# Patient Record
Sex: Female | Born: 1980 | Race: Black or African American | Hispanic: No | Marital: Single | Smoking: Never smoker
Health system: Southern US, Community
[De-identification: ages and names within clinical notes are randomized; demographics above are authoritative.]

## PROBLEM LIST (undated history)

## (undated) DIAGNOSIS — I10 Essential (primary) hypertension: Secondary | ICD-10-CM

## (undated) DIAGNOSIS — R079 Chest pain, unspecified: Secondary | ICD-10-CM

## (undated) DIAGNOSIS — D649 Anemia, unspecified: Secondary | ICD-10-CM

## (undated) HISTORY — PX: ABDOMINAL HYSTERECTOMY: SHX81

## (undated) HISTORY — PX: LEEP: SHX91

---

## 2004-05-12 ENCOUNTER — Other Ambulatory Visit: Payer: Self-pay

## 2004-12-06 ENCOUNTER — Emergency Department: Payer: Self-pay | Admitting: Emergency Medicine

## 2005-06-20 ENCOUNTER — Emergency Department: Payer: Self-pay | Admitting: Emergency Medicine

## 2006-05-26 ENCOUNTER — Emergency Department: Payer: Self-pay | Admitting: Unknown Physician Specialty

## 2006-05-27 ENCOUNTER — Emergency Department: Payer: Self-pay | Admitting: Unknown Physician Specialty

## 2007-06-05 ENCOUNTER — Emergency Department: Payer: Self-pay | Admitting: Emergency Medicine

## 2007-10-13 ENCOUNTER — Emergency Department: Payer: Self-pay

## 2011-01-13 ENCOUNTER — Emergency Department: Payer: Self-pay | Admitting: Emergency Medicine

## 2012-06-01 ENCOUNTER — Inpatient Hospital Stay: Payer: Self-pay | Admitting: Psychiatry

## 2012-06-01 LAB — URINALYSIS, COMPLETE
Bilirubin,UR: NEGATIVE
Blood: NEGATIVE
Glucose,UR: NEGATIVE mg/dL (ref 0–75)
Ketone: NEGATIVE
Ph: 7 (ref 4.5–8.0)
RBC,UR: 2 /HPF (ref 0–5)
Specific Gravity: 1.02 (ref 1.003–1.030)
Squamous Epithelial: 9

## 2012-06-01 LAB — CBC
HCT: 39.4 % (ref 35.0–47.0)
HGB: 13.2 g/dL (ref 12.0–16.0)
MCH: 28.2 pg (ref 26.0–34.0)
MCV: 84 fL (ref 80–100)
Platelet: 361 10*3/uL (ref 150–440)
RBC: 4.66 10*6/uL (ref 3.80–5.20)
WBC: 11.8 10*3/uL — ABNORMAL HIGH (ref 3.6–11.0)

## 2012-06-01 LAB — COMPREHENSIVE METABOLIC PANEL
Albumin: 3.6 g/dL (ref 3.4–5.0)
Alkaline Phosphatase: 90 U/L (ref 50–136)
Bilirubin,Total: 0.4 mg/dL (ref 0.2–1.0)
Calcium, Total: 9.1 mg/dL (ref 8.5–10.1)
Co2: 25 mmol/L (ref 21–32)
Creatinine: 0.97 mg/dL (ref 0.60–1.30)
EGFR (Non-African Amer.): >60
Glucose: 85 mg/dL (ref 65–99)
SGPT (ALT): 37 U/L (ref 12–78)
Total Protein: 8.6 g/dL — ABNORMAL HIGH (ref 6.4–8.2)

## 2012-06-01 LAB — PREGNANCY, URINE: Pregnancy Test, Urine: NEGATIVE m[IU]/mL

## 2012-06-01 LAB — DRUG SCREEN, URINE
Barbiturates, Ur Screen: NEGATIVE (ref ?–200)
Cocaine Metabolite,Ur ~~LOC~~: NEGATIVE (ref ?–300)
MDMA (Ecstasy)Ur Screen: NEGATIVE (ref ?–500)
Phencyclidine (PCP) Ur S: NEGATIVE (ref ?–25)
Tricyclic, Ur Screen: NEGATIVE (ref ?–1000)

## 2012-06-01 LAB — ETHANOL: Ethanol %: <0.003 % (ref 0.000–0.080)

## 2012-06-01 LAB — ACETAMINOPHEN LEVEL: Acetaminophen: <2 ug/mL

## 2012-06-01 LAB — SALICYLATE LEVEL: Salicylates, Serum: <1.7 mg/dL

## 2012-06-03 LAB — URINE CULTURE

## 2012-06-06 LAB — VALPROIC ACID LEVEL: Valproic Acid: 112 ug/mL — ABNORMAL HIGH

## 2012-07-14 ENCOUNTER — Emergency Department: Payer: Self-pay | Admitting: Emergency Medicine

## 2012-12-15 ENCOUNTER — Emergency Department: Payer: Self-pay | Admitting: Unknown Physician Specialty

## 2013-04-15 ENCOUNTER — Emergency Department: Payer: Self-pay | Admitting: Emergency Medicine

## 2013-04-15 LAB — URINALYSIS, COMPLETE
Bilirubin,UR: NEGATIVE
Ketone: NEGATIVE
Nitrite: NEGATIVE
Ph: 7 (ref 4.5–8.0)
Protein: NEGATIVE
Specific Gravity: 1.019 (ref 1.003–1.030)
WBC UR: 3 /HPF (ref 0–5)

## 2013-04-23 ENCOUNTER — Emergency Department: Payer: Self-pay | Admitting: Emergency Medicine

## 2013-04-23 LAB — COMPREHENSIVE METABOLIC PANEL
Albumin: 3.1 g/dL — ABNORMAL LOW (ref 3.4–5.0)
Anion Gap: 8 (ref 7–16)
Bilirubin,Total: 0.5 mg/dL (ref 0.2–1.0)
Calcium, Total: 8.9 mg/dL (ref 8.5–10.1)
Co2: 26 mmol/L (ref 21–32)
Creatinine: 0.76 mg/dL (ref 0.60–1.30)
EGFR (Non-African Amer.): >60
Glucose: 87 mg/dL (ref 65–99)
Osmolality: 273 (ref 275–301)
SGOT(AST): 19 U/L (ref 15–37)
SGPT (ALT): 18 U/L (ref 12–78)
Sodium: 138 mmol/L (ref 136–145)

## 2013-04-23 LAB — URINALYSIS, COMPLETE
Bilirubin,UR: NEGATIVE
Nitrite: NEGATIVE
Ph: 7 (ref 4.5–8.0)
Protein: 30
Specific Gravity: 1.018 (ref 1.003–1.030)
Squamous Epithelial: 6
WBC UR: 11 /HPF (ref 0–5)

## 2013-04-23 LAB — CBC
MCH: 27.5 pg (ref 26.0–34.0)
MCHC: 33.6 g/dL (ref 32.0–36.0)
RBC: 4.68 10*6/uL (ref 3.80–5.20)
RDW: 14.8 % — ABNORMAL HIGH (ref 11.5–14.5)

## 2013-06-27 ENCOUNTER — Emergency Department: Payer: Self-pay | Admitting: Internal Medicine

## 2013-06-27 LAB — URINALYSIS, COMPLETE
Bacteria: NONE SEEN
Blood: NEGATIVE
Glucose,UR: NEGATIVE mg/dL (ref 0–75)
Leukocyte Esterase: NEGATIVE
Protein: NEGATIVE
RBC,UR: <1 /HPF (ref 0–5)
Squamous Epithelial: 1

## 2013-06-27 LAB — DRUG SCREEN, URINE
Barbiturates, Ur Screen: NEGATIVE (ref ?–200)
Benzodiazepine, Ur Scrn: NEGATIVE (ref ?–200)
Cannabinoid 50 Ng, Ur ~~LOC~~: NEGATIVE (ref ?–50)
Methadone, Ur Screen: NEGATIVE (ref ?–300)
Opiate, Ur Screen: NEGATIVE (ref ?–300)
Tricyclic, Ur Screen: NEGATIVE (ref ?–1000)

## 2013-06-27 LAB — ETHANOL
Ethanol %: <0.003 % (ref 0.000–0.080)
Ethanol: <3 mg/dL

## 2013-06-27 LAB — COMPREHENSIVE METABOLIC PANEL
Co2: 29 mmol/L (ref 21–32)
EGFR (African American): >60
EGFR (Non-African Amer.): >60
Glucose: 82 mg/dL (ref 65–99)
Osmolality: 272 (ref 275–301)
Potassium: 3.8 mmol/L (ref 3.5–5.1)
SGOT(AST): 14 U/L — ABNORMAL LOW (ref 15–37)
SGPT (ALT): 22 U/L (ref 12–78)
Sodium: 137 mmol/L (ref 136–145)

## 2013-06-27 LAB — CBC
HCT: 38.5 % (ref 35.0–47.0)
HGB: 12.9 g/dL (ref 12.0–16.0)
RDW: 15.6 % — ABNORMAL HIGH (ref 11.5–14.5)

## 2013-06-27 LAB — ACETAMINOPHEN LEVEL: Acetaminophen: <2 ug/mL

## 2013-06-27 LAB — SALICYLATE LEVEL: Salicylates, Serum: <1.7 mg/dL

## 2013-06-27 LAB — TSH: Thyroid Stimulating Horm: 1.86 u[IU]/mL

## 2013-11-20 ENCOUNTER — Emergency Department: Payer: Self-pay | Admitting: Emergency Medicine

## 2013-11-20 LAB — CBC WITH DIFFERENTIAL/PLATELET
BASOS PCT: 1.3 %
Basophil #: 0.2 10*3/uL — ABNORMAL HIGH (ref 0.0–0.1)
EOS PCT: 2.4 %
Eosinophil #: 0.3 10*3/uL (ref 0.0–0.7)
HCT: 35.6 % (ref 35.0–47.0)
HGB: 12.3 g/dL (ref 12.0–16.0)
LYMPHS ABS: 3.6 10*3/uL (ref 1.0–3.6)
Lymphocyte %: 29.2 %
MCH: 28.6 pg (ref 26.0–34.0)
MCHC: 34.6 g/dL (ref 32.0–36.0)
MCV: 83 fL (ref 80–100)
MONOS PCT: 8.6 %
Monocyte #: 1 x10 3/mm — ABNORMAL HIGH (ref 0.2–0.9)
NEUTROS ABS: 7.1 10*3/uL — AB (ref 1.4–6.5)
NEUTROS PCT: 58.5 %
Platelet: 381 10*3/uL (ref 150–440)
RBC: 4.29 10*6/uL (ref 3.80–5.20)
RDW: 15.6 % — ABNORMAL HIGH (ref 11.5–14.5)
WBC: 12.2 10*3/uL — AB (ref 3.6–11.0)

## 2013-11-20 LAB — URINALYSIS, COMPLETE
Bilirubin,UR: NEGATIVE
Blood: NEGATIVE
Glucose,UR: NEGATIVE mg/dL (ref 0–75)
Ketone: NEGATIVE
Leukocyte Esterase: NEGATIVE
Nitrite: NEGATIVE
Ph: 6 (ref 4.5–8.0)
Protein: 30
Specific Gravity: 1.019 (ref 1.003–1.030)
Squamous Epithelial: 2
WBC UR: 3 /HPF (ref 0–5)

## 2013-11-20 LAB — COMPREHENSIVE METABOLIC PANEL
ANION GAP: 2 — AB (ref 7–16)
Albumin: 3.1 g/dL — ABNORMAL LOW (ref 3.4–5.0)
Alkaline Phosphatase: 83 U/L
BUN: 12 mg/dL (ref 7–18)
Bilirubin,Total: 0.5 mg/dL (ref 0.2–1.0)
CO2: 28 mmol/L (ref 21–32)
CREATININE: 0.63 mg/dL (ref 0.60–1.30)
Calcium, Total: 9 mg/dL (ref 8.5–10.1)
Chloride: 104 mmol/L (ref 98–107)
EGFR (Non-African Amer.): >60
GLUCOSE: 84 mg/dL (ref 65–99)
Osmolality: 267 (ref 275–301)
POTASSIUM: 5.6 mmol/L — AB (ref 3.5–5.1)
SGOT(AST): 56 U/L — ABNORMAL HIGH (ref 15–37)
SGPT (ALT): 21 U/L (ref 12–78)
SODIUM: 134 mmol/L — AB (ref 136–145)
TOTAL PROTEIN: 8.2 g/dL (ref 6.4–8.2)

## 2013-11-20 LAB — LIPASE, BLOOD: Lipase: 73 U/L (ref 73–393)

## 2014-01-28 ENCOUNTER — Emergency Department: Payer: Self-pay | Admitting: Emergency Medicine

## 2014-01-28 LAB — URINALYSIS, COMPLETE
BLOOD: NEGATIVE
Bilirubin,UR: NEGATIVE
GLUCOSE, UR: NEGATIVE mg/dL (ref 0–75)
KETONE: NEGATIVE
Nitrite: POSITIVE
PH: 6 (ref 4.5–8.0)
Protein: 100
RBC,UR: 5 /HPF (ref 0–5)
Specific Gravity: 1.026 (ref 1.003–1.030)
Squamous Epithelial: 10

## 2014-02-05 ENCOUNTER — Emergency Department: Payer: Self-pay | Admitting: Emergency Medicine

## 2014-02-05 LAB — CBC WITH DIFFERENTIAL/PLATELET
Basophil #: 0.1 10*3/uL (ref 0.0–0.1)
Basophil %: 0.7 %
Eosinophil #: 0.2 10*3/uL (ref 0.0–0.7)
Eosinophil %: 1.2 %
HCT: 37.6 % (ref 35.0–47.0)
HGB: 12.3 g/dL (ref 12.0–16.0)
LYMPHS PCT: 21.1 %
Lymphocyte #: 3 10*3/uL (ref 1.0–3.6)
MCH: 26.8 pg (ref 26.0–34.0)
MCHC: 32.7 g/dL (ref 32.0–36.0)
MCV: 82 fL (ref 80–100)
MONO ABS: 1.2 x10 3/mm — AB (ref 0.2–0.9)
Monocyte %: 8.6 %
NEUTROS ABS: 9.6 10*3/uL — AB (ref 1.4–6.5)
NEUTROS PCT: 68.4 %
PLATELETS: 366 10*3/uL (ref 150–440)
RBC: 4.58 10*6/uL (ref 3.80–5.20)
RDW: 16.2 % — ABNORMAL HIGH (ref 11.5–14.5)
WBC: 14 10*3/uL — AB (ref 3.6–11.0)

## 2014-02-05 LAB — COMPREHENSIVE METABOLIC PANEL
Albumin: 2.9 g/dL — ABNORMAL LOW (ref 3.4–5.0)
Alkaline Phosphatase: 84 U/L
Anion Gap: 4 — ABNORMAL LOW (ref 7–16)
BUN: 8 mg/dL (ref 7–18)
Bilirubin,Total: 0.4 mg/dL (ref 0.2–1.0)
CALCIUM: 9.3 mg/dL (ref 8.5–10.1)
CHLORIDE: 105 mmol/L (ref 98–107)
Co2: 28 mmol/L (ref 21–32)
Creatinine: 0.89 mg/dL (ref 0.60–1.30)
EGFR (Non-African Amer.): >60
GLUCOSE: 86 mg/dL (ref 65–99)
Osmolality: 271 (ref 275–301)
Potassium: 3.7 mmol/L (ref 3.5–5.1)
SGOT(AST): 12 U/L — ABNORMAL LOW (ref 15–37)
SGPT (ALT): 33 U/L (ref 12–78)
SODIUM: 137 mmol/L (ref 136–145)
Total Protein: 7.7 g/dL (ref 6.4–8.2)

## 2014-02-05 LAB — URINALYSIS, COMPLETE
BLOOD: NEGATIVE
Bilirubin,UR: NEGATIVE
GLUCOSE, UR: NEGATIVE mg/dL (ref 0–75)
KETONE: NEGATIVE
Leukocyte Esterase: NEGATIVE
Nitrite: POSITIVE
Ph: 6 (ref 4.5–8.0)
Protein: NEGATIVE
RBC,UR: <1 /HPF (ref 0–5)
Specific Gravity: 1.019 (ref 1.003–1.030)
Squamous Epithelial: <1
WBC UR: 3 /HPF (ref 0–5)

## 2014-02-05 LAB — HCG, QUANTITATIVE, PREGNANCY: Beta Hcg, Quant.: 94722 m[IU]/mL — ABNORMAL HIGH

## 2014-03-04 ENCOUNTER — Emergency Department: Payer: Self-pay | Admitting: Emergency Medicine

## 2014-03-04 LAB — CBC
HCT: 36 % (ref 35.0–47.0)
HGB: 12.1 g/dL (ref 12.0–16.0)
MCH: 27.6 pg (ref 26.0–34.0)
MCHC: 33.6 g/dL (ref 32.0–36.0)
MCV: 82 fL (ref 80–100)
PLATELETS: 323 10*3/uL (ref 150–440)
RBC: 4.39 10*6/uL (ref 3.80–5.20)
RDW: 15.7 % — ABNORMAL HIGH (ref 11.5–14.5)
WBC: 13.3 10*3/uL — ABNORMAL HIGH (ref 3.6–11.0)

## 2014-03-04 LAB — URINALYSIS, COMPLETE
BLOOD: NEGATIVE
Bilirubin,UR: NEGATIVE
Glucose,UR: NEGATIVE mg/dL (ref 0–75)
Ketone: NEGATIVE
Leukocyte Esterase: NEGATIVE
Nitrite: NEGATIVE
Ph: 6 (ref 4.5–8.0)
Protein: 30
Specific Gravity: 1.02 (ref 1.003–1.030)

## 2014-03-04 LAB — HEPATIC FUNCTION PANEL A (ARMC)
AST: 21 U/L (ref 15–37)
Albumin: 2.8 g/dL — ABNORMAL LOW (ref 3.4–5.0)
Alkaline Phosphatase: 77 U/L
Bilirubin, Direct: <0.1 mg/dL (ref 0.00–0.20)
Bilirubin,Total: 0.3 mg/dL (ref 0.2–1.0)
SGPT (ALT): 25 U/L (ref 12–78)
Total Protein: 7.5 g/dL (ref 6.4–8.2)

## 2014-03-04 LAB — HCG, QUANTITATIVE, PREGNANCY: Beta Hcg, Quant.: 81636 m[IU]/mL — ABNORMAL HIGH

## 2014-04-18 ENCOUNTER — Emergency Department: Payer: Self-pay | Admitting: Emergency Medicine

## 2014-04-18 LAB — COMPREHENSIVE METABOLIC PANEL
ALBUMIN: 2.7 g/dL — AB (ref 3.4–5.0)
ALK PHOS: 90 U/L
ANION GAP: 10 (ref 7–16)
BILIRUBIN TOTAL: 0.4 mg/dL (ref 0.2–1.0)
BUN: 7 mg/dL (ref 7–18)
CALCIUM: 9 mg/dL (ref 8.5–10.1)
CHLORIDE: 105 mmol/L (ref 98–107)
Co2: 21 mmol/L (ref 21–32)
Creatinine: 0.91 mg/dL (ref 0.60–1.30)
Glucose: 105 mg/dL — ABNORMAL HIGH (ref 65–99)
OSMOLALITY: 270 (ref 275–301)
Potassium: 3.4 mmol/L — ABNORMAL LOW (ref 3.5–5.1)
SGOT(AST): 33 U/L (ref 15–37)
SGPT (ALT): 45 U/L (ref 12–78)
Sodium: 136 mmol/L (ref 136–145)
TOTAL PROTEIN: 7.6 g/dL (ref 6.4–8.2)

## 2014-04-18 LAB — URINALYSIS, COMPLETE
Bilirubin,UR: NEGATIVE
Blood: NEGATIVE
Glucose,UR: NEGATIVE mg/dL (ref 0–75)
Ketone: NEGATIVE
NITRITE: NEGATIVE
Ph: 5 (ref 4.5–8.0)
Protein: 100
SPECIFIC GRAVITY: 1.033 (ref 1.003–1.030)
Squamous Epithelial: 2
WBC UR: 13 /HPF (ref 0–5)

## 2014-04-18 LAB — CBC
HCT: 33.9 % — ABNORMAL LOW (ref 35.0–47.0)
HGB: 11.2 g/dL — ABNORMAL LOW (ref 12.0–16.0)
MCH: 27.3 pg (ref 26.0–34.0)
MCHC: 33.1 g/dL (ref 32.0–36.0)
MCV: 83 fL (ref 80–100)
Platelet: 353 10*3/uL (ref 150–440)
RBC: 4.11 10*6/uL (ref 3.80–5.20)
RDW: 16.1 % — ABNORMAL HIGH (ref 11.5–14.5)
WBC: 14.7 10*3/uL — ABNORMAL HIGH (ref 3.6–11.0)

## 2014-04-18 LAB — HCG, QUANTITATIVE, PREGNANCY: Beta Hcg, Quant.: 23459 m[IU]/mL — ABNORMAL HIGH

## 2014-04-18 LAB — LIPASE, BLOOD: LIPASE: 98 U/L (ref 73–393)

## 2014-05-16 ENCOUNTER — Observation Stay: Payer: Self-pay | Admitting: Obstetrics & Gynecology

## 2014-05-16 LAB — PIH PROFILE
Anion Gap: 9 (ref 7–16)
BUN: 9 mg/dL (ref 7–18)
CALCIUM: 9.3 mg/dL (ref 8.5–10.1)
Chloride: 103 mmol/L (ref 98–107)
Co2: 24 mmol/L (ref 21–32)
Creatinine: 0.66 mg/dL (ref 0.60–1.30)
EGFR (African American): >60
EGFR (Non-African Amer.): >60
Glucose: 85 mg/dL (ref 65–99)
HCT: 34.3 % — ABNORMAL LOW (ref 35.0–47.0)
HGB: 11.3 g/dL — ABNORMAL LOW (ref 12.0–16.0)
MCH: 27.2 pg (ref 26.0–34.0)
MCHC: 33 g/dL (ref 32.0–36.0)
MCV: 83 fL (ref 80–100)
OSMOLALITY: 270 (ref 275–301)
Platelet: 358 10*3/uL (ref 150–440)
Potassium: 3.4 mmol/L — ABNORMAL LOW (ref 3.5–5.1)
RBC: 4.16 10*6/uL (ref 3.80–5.20)
RDW: 16.1 % — AB (ref 11.5–14.5)
SGOT(AST): 19 U/L (ref 15–37)
Sodium: 136 mmol/L (ref 136–145)
Uric Acid: 4.5 mg/dL (ref 2.6–6.0)
WBC: 16 10*3/uL — ABNORMAL HIGH (ref 3.6–11.0)

## 2014-05-16 LAB — DRUG SCREEN, URINE

## 2014-05-18 LAB — PROTEIN, URINE, 24 HOUR
Collection Hours: 24 hours
PROTEIN, 24 HOUR URINE: 176 mg/(24.h) — AB (ref 30–149)
PROTEIN, URINE: 22 mg/dL (ref 0–12)
Total Volume: 800 mL

## 2014-05-20 ENCOUNTER — Observation Stay: Payer: Self-pay

## 2014-05-20 ENCOUNTER — Encounter: Payer: Self-pay | Admitting: Maternal & Fetal Medicine

## 2014-06-10 ENCOUNTER — Encounter: Payer: Self-pay | Admitting: Obstetrics & Gynecology

## 2014-07-16 DIAGNOSIS — G43909 Migraine, unspecified, not intractable, without status migrainosus: Secondary | ICD-10-CM | POA: Insufficient documentation

## 2014-07-16 DIAGNOSIS — O99345 Other mental disorders complicating the puerperium: Secondary | ICD-10-CM | POA: Insufficient documentation

## 2014-07-16 DIAGNOSIS — I10 Essential (primary) hypertension: Secondary | ICD-10-CM | POA: Insufficient documentation

## 2014-07-16 DIAGNOSIS — F53 Postpartum depression: Secondary | ICD-10-CM | POA: Insufficient documentation

## 2014-08-19 DIAGNOSIS — N871 Moderate cervical dysplasia: Secondary | ICD-10-CM | POA: Insufficient documentation

## 2015-01-05 ENCOUNTER — Emergency Department: Payer: Self-pay | Admitting: Emergency Medicine

## 2015-02-11 NOTE — Discharge Summary (Signed)
PATIENT NAME:  LEMOYNE, Maria Dean MR#:  [id number] DATE OF BIRTH:  [birth year]  DATE OF ADMISSION:  06/01/2012 DATE OF DISCHARGE:  06/07/2012  HOSPITAL COURSE: See the dictated History and Physical for details. This 34 year old woman presented to the Emergency Room with complaints of mood lability with some alternating or mixed suicidal and at times aggressive ideation, hallucinations, feeling out of control. Had been having severe headaches, had not been sleeping recently. She had not been abusing substances but had not been getting any psychiatric treatment. Past history suggested probably one manic or mixed episode previously. Stress level was marked by her being out of work, currently in school, worried about her children but not acutely overwhelmed. There was no immediate crisis going on with her children. The presentation and looked and sounded most like probably a mixed bipolar episode. Because of the headaches as well as the mixed severe symptoms and the long periods of stability, we did pursue a work-up for any other medical causes during the time she was in the hospital.  MRI scan was done of her head which showed an abnormality in the clivus area that was undetermined on the MRI. At the recommendation of radiology, a follow-up CT scan was done focusing on that area and the neck. 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 We did do urine free 24-hour catecholamines looking for the rare but serious pheochromocytoma. The results of these, however, were entirely normal. Meanwhile, the patient was treated with medicines for bipolar disorder, specifically, Depakote and Risperdal. She was also treated with propranolol for her high blood pressure which was quite remarkable early on. Blood pressure gradually resolved. Mood states and symptoms resolved. The patient remained withdrawn and did not participate  much during her hospital stay. She tended to stay in her room most of the time. At first she was reporting still having depressed mood and some hallucinations, but those improved during her hospital stay. Her affect became more consistently stable. She did not display any aggressive behavior, did not repeat suicidal ideation. By the time of discharge it looked most likely that she does have a bipolar disorder that had presented with a mixed episode and had responded to medication. She appeared to be back to her baseline without acute dangerousness. The patient was educated in depth about appropriate treatment and management including the necessity of staying on prophylactic medicine, following up with outpatient treatment, totally avoiding intoxicating substances, maintaining a her regular sleep schedule, and managing stress. She was agreeable to all of these and agreeable to outpatient treatment referral which was made to [facility].  LABORATORY, DIAGNOSTIC, AND RADIOLOGICAL DATA: Valproic acid level done on 08/13 just before discharge was 112, which is slightly above the normal range as it is usually considered, but I felt was an appropriate range considering she was having no side effects to it. Free catecholamines without going into specific details were all completely within the normal range and unremarkable. MRI scan as noted above had one abnormality seen but on CT it appeared to be a nonsignificant congenital finding without any sign of tumor. Urinalysis was done because of abnormal urinalysis when she came in. Culture, however, was suggestive of contaminants and she was treated symptomatically. Pregnancy test negative. Urinalysis as previously mentioned had 10 white blood cells, 2 red blood cells, and 2+ leukocyte esterase. Drug screen negative. TSH normal at 1.7. CBC showed a slightly elevated white  count at 11.8, otherwise unremarkable. Alcohol level undetectable. Chemistry panel slightly elevated. Protein  at 8.6, otherwise unremarkable.   DISCHARGE MEDICATIONS:  1. Depakote 500 mg 3 times per day.  2. Restoril 15 mg at bedtime.  3. Risperdal 3 mg at bedtime.  4. Propranolol LA 80 mg per day.  5. Hydrochlorothiazide 50 mg per day.   MENTAL STATUS EXAM AT DISCHARGE: Neatly dressed and groomed woman. Cooperative and pleasant with the interview. Eye contact normal. Psychomotor activity normal. Speech normal in rate, tone, and volume. Affect euthymic, reactive, and appropriate. Mood stated as being good. Thoughts are lucid without any sign of bizarre or delusional thinking. Denies hallucinations. Denies any suicidal or homicidal ideation. Shows intact insight and judgment. Short and long-term memory grossly intact. Intelligence is at least average. No sign of acute dangerousness. Expresses good understanding of the treatment plan and risks and benefits.   DISPOSITION: She is discharged back to the home that she had been staying at with a referral appointment made for the local mental health agency [facility] for outpatient medication and therapy followup.   DIAGNOSIS PRINCIPLE AND PRIMARY:  AXIS I: Bipolar disorder, type I, mixed episode.   SECONDARY DIAGNOSES:  AXIS I: No further diagnosis.   AXIS II: No diagnosis.   AXIS III:  Hypertension. Headaches, possibly migraine in type. Overweight.  Urinary tract infection, resolved.   AXIS IV: Moderate stress from being out of work, being somewhat unstable in her living situation.   AXIS V: Functioning at time of discharge: 60.     ____________________________ Gonzella Lex, MD jtc:bjt D: 06/20/2012 11:28:13 ET T: 06/20/2012 12:24:46 ET JOB#: [id number]  cc: Gonzella Lex, MD, <Dictator> Gonzella Lex MD ELECTRONICALLY SIGNED 06/20/2012 16:46

## 2015-02-11 NOTE — Consult Note (Signed)
Psychological Assessment  Maria  Jones30of Evaluation: 8-12-13Administered: [place] (MMPI-2) for Referral: Ms. Dona was referred for a psychological assessment by her physician, Christoper Allegra, MD.  She was admitted to [facility] for the treatment mood liability with auditory hallucinations. Please see the history and physical and psychosocial history for further background information. An assessment of personality structure was requested. The MMPI-2 validity scales indicate that the clinical profile is not valid. This score is consistent with malingering or a serious disturbance. If the score is not exaggerated related to malingering the profile is probably not stable as it reflects an acute disturbance. Impression:is not interpretable.Psychotic Disorder NOS   Electronic Signatures: Garald Braver (PsyD, HSP-P)  (Signed on 13-Aug-13 12:24)  Authored  Last Updated: 13-Aug-13 12:24 by Garald Braver (PsyD, HSP-P)

## 2015-02-11 NOTE — H&P (Signed)
PATIENT NAME:  Maria Dean, Maria Dean MR#:  [id number] DATE OF BIRTH:  Jan 01, 1981  DATE OF ADMISSION:  06/01/2012  IDENTIFYING INFORMATION AND CHIEF COMPLAINT: 34 year old woman came into the Emergency Room seeking evaluation for mood lability, inability to sleep, racing thoughts, hallucinations at times.   CHIEF COMPLAINT: "I've been through a lot."   HISTORY OF PRESENT ILLNESS: Information obtained from the patient. She reports that for the past two months she has been going through an episode characterized by mood swings in which she will at one moment be crying and at other moments be happy and agitated. Has also been at times feeling more irritable and angry. She has not been sleeping well. Says that she will go days where she does not sleep at all and still does not feel tired. At times she has auditory hallucinations hearing her name be called. She denies any suicidal ideation or homicidal ideation. She feels like she has not been able to take care of her usual activities. She is worried about taking care of her children when they come back from vacation. She has not been to see anybody for any treatment for this episode over the last couple of months as it has been getting worse. She denies that she abuses drugs or uses any alcohol. She is not clear that there has been any major new stress in her life.   PAST PSYCHIATRIC HISTORY: She says that in 2008 she had a similar episode but it only lasted for a few days. She also says that she has seen a doctor for problems with her mood in the past and for insomnia. She remembers having been treated with Paxil which she only took for about 2 to 3 weeks before it was discontinued. She does not recall having any effect. Denies any history of suicide attempts or any history of violence. No previous psychiatric hospitalizations. No previous diagnosis known.   PAST MEDICAL HISTORY: Patient denies having any ongoing or acute medical problems.   SOCIAL HISTORY:  Patient is currently not working. She is getting an online degree which takes up a lot of her time. She is currently staying with a friend of hers. She has three young children but they are staying with their grandmother for the summer. Patient has worked at call centers in the past. Graduated from high school. Not married or in any current relationship.   FAMILY HISTORY: She does not know of any family history of mental illness at all.   REVIEW OF SYSTEMS: Complains that her mood has been labile with crying and laughing spells alternating. Not sleeping well at all. Occasional auditory hallucinations. Denies suicidal ideation. Denies any new physical symptoms.   MENTAL STATUS EXAM: Slightly disheveled woman interviewed in the Emergency Room. She is alert and oriented and cooperative with the interview. Eye contact is good. Psychomotor activity is a little bit fidgety but not extremely so. Speech is normal rate, tone, and volume. Affect is currently euthymic, calm, smiling. Mood is stated as being up and down. Thoughts appear generally lucid. No obvious loosening of associations or delusional thinking. Denies any hallucinations at this time but has recently had auditory hallucinations. Denies any suicidal or homicidal ideation. Judgment and insight currently seem to be adequate. Intelligence is at least average. Short and longer term memory grossly intact.   PHYSICAL EXAMINATION:  GENERAL: Patient is a moderately overweight woman who does not appear to be in any immediate physical distress. She does not appear to be in any pain.  SKIN: No acute skin lesions. Her eyes are muddy bilaterally but do not appear bloodshot or traumatized.   HEENT: Pupils are equal and reactive. Face and head are symmetric.   NECK: Nontender.   BACK: Nontender.   MUSCULOSKELETAL: Full range of motion at all extremities and normal gait. Strength and reflexes normal throughout.   LUNGS: Clear with no wheezes.   HEART:  Regular rate and rhythm. No extra sounds.   ABDOMEN: Soft, nontender, normal bowel sounds.   VITAL SIGNS: Vital signs most recently show a temperature of 98.3, pulse 107, respirations 20, and blood pressure elevated at 172/117, we have not had a repeat on that yet.   ASSESSMENT: This is a 34 year old woman with two months worth of symptoms of mood lability, not sleeping, agitation clearly different from her baseline. Some psychotic symptoms. Not acutely suicidal. Has had past episode similar to this. No cleared treatment history but the current presentation sounds most like a mixed episode of bipolar disorder. Could also be a brief psychotic episode or stress reaction. [facility] admitting patient to the hospital because of the severity of the symptoms and need for immediate improvement and the fact that she has not been able to get in to see a psychiatrist otherwise.   TREATMENT PLAN: Admit to psychiatry. I am going to start her on Depakote at night as well as try to get her a night's sleep with some Ambien on top of that, p.r.n. Ativan for anxiety and agitation. Engage patient in groups and activities for education and therapy. Try and get collateral history. Work on follow-up planning.   DIAGNOSIS PRINCIPLE AND PRIMARY:  AXIS I: Bipolar disorder, not otherwise specified.   SECONDARY DIAGNOSES:  AXIS I: No further.   AXIS II: No diagnosis.   AXIS III: Hypertension, unclear etiology.   AXIS IV: Moderate. Chronic stress from joblessness.   AXIS V: Functioning at time of admission is 91.   ____________________________ Gonzella Lex, MD jtc:cms D: 06/01/2012 18:45:27 ET T: 06/02/2012 06:27:42 ET JOB#: [id number]  cc: Gonzella Lex, MD, <Dictator> Gonzella Lex MD ELECTRONICALLY SIGNED 06/02/2012 9:00

## 2015-02-11 NOTE — Consult Note (Signed)
Brief Consult Note: Diagnosis: bipolar nos.   Patient was seen by consultant.   Recommend further assessment or treatment.   Orders entered.   Comments: Psychiatry: Patient seen. Patient having mixed agitation and mood lability and unable to sleep. Auditory hallucinations at times. Will admit to [facility] and start meds for mood stabilization. Patient agreeable.  Electronic Signatures: Gonzella Lex (MD)  (Signed 08-Aug-13 18:38)  Authored: Brief Consult Note   Last Updated: 08-Aug-13 18:38 by Gonzella Lex (MD)

## 2015-02-14 NOTE — Consult Note (Signed)
Brief Consult Note: Diagnosis: bipolar disorder.   Patient was seen by consultant.   Consult note dictated.   Recommend further assessment or treatment.   Orders entered.   Comments: Psychiatry: Patient seen. Chart reviewed. Full note done. Patient with history of bipolar disorder is having return of symptoms since being off medication. Will restart meds. Unit currently full. REevaluatte tomorrow.  Electronic Signatures: Gonzella Lex (MD)  (Signed 03-Sep-14 22:59)  Authored: Brief Consult Note   Last Updated: 03-Sep-14 22:59 by Gonzella Lex (MD)

## 2015-02-14 NOTE — Consult Note (Signed)
Brief Consult Note: Diagnosis: Bipolar disorder.   Patient was seen by consultant.   Consult note dictated.   Recommend further assessment or treatment.   Orders entered.   Comments: Pt sen in Ed. She stated that she came here as she was feeling stressed oout and having mood swings related to non complaince with her medications. She was not following with [facility] for the last 4-5 months. She was restarted back on her medications and now she is feeling better. She slept well. Lives with her family. She is planning to follow with Dr A. Denied mood swings, agitation and paranoia.  No Si/HI or plans noted.   Plan; Will be d/c from [facility] Prescription given.  Follow up at [facility].  Electronic Signatures: Jeronimo Norma (MD)  (Signed 04-Sep-14 11:44)  Authored: Brief Consult Note   Last Updated: 04-Sep-14 11:44 by Jeronimo Norma (MD)

## 2015-02-14 NOTE — Consult Note (Signed)
PATIENT NAME:  Maria Dean, Maria Dean MR#:  [id number] DATE OF BIRTH:  October 03, 1981  DATE OF CONSULTATION:  06/27/2013  CONSULTING PHYSICIAN:  Gonzella Lex, MD  IDENTIFYING INFORMATION AND REASON FOR CONSULT: This is a 34 year old woman with a history of bipolar disorder who presented with a return of symptoms.   CHIEF COMPLAINT: "I didn't know where else to go."   HISTORY OF PRESENT ILLNESS: Information obtained from the patient and the chart. The patient presented voluntarily to the Emergency Room today stating she had had a return of her symptoms. For the last few months, her mood has been getting increasingly angry and irritable. She is losing her temper more frequently. Also, feeling sad and depressed. She is not sleeping more than a couple hours a night. Appetite has been poor. She has been having a return of auditory hallucination that sounds like her own voice saying negative things to her. She has had some suicidal thoughts, but has not acted on it. She has been off of all of her psychiatric medicines for several months because her insurance lapsed. As a result, she has not been following up with her outpatient provider. She is also off of all of her medical medications for her blood pressure. She has continued to work and take care of her children, but her life has become more stressful. She is currently also living with her sister and her sister's children, which has brought extra drama into her life. Not abusing any substances.   PAST PSYCHIATRIC HISTORY:  One previous hospitalization here about a year ago for similar kind of symptoms. Diagnosis at that time was bipolar disorder mixed. She also had a history of prior to that of other episodes. The patient responded well to medication previously and had followed up with [facility] until her insurance lapsed. She has not had any actual suicide attempts. No history of violence to others. She was taking Depakote and Risperdal as well as temazepam for her  sleep as primary medication previously.   PAST MEDICAL HISTORY: The patient is overweight, but also has severe high blood pressure. When she is not on medication, it can easily run to a systolic over 026 and a diastolic over 378. She also has a history of chronic headaches, some of them migraines.   SOCIAL HISTORY: The patient is a single mother of three adolescent children. She is currently working, at [employer]. She has struggled to work and go to school over the last couple years. She is currently staying with family members and although she is not fighting with them, she says that produces extra stress in her life.   SUBSTANCE ABUSE HISTORY: No past history of substance abuse.   FAMILY HISTORY: Unknown.   CURRENT MEDICATIONS: None.   ALLERGIES: No known drug allergies.   REVIEW OF SYSTEMS: Depressed and irritable mood. Mood swings. Auditory hallucinations. Poor sleep. Poor appetite. Denies homicidal ideation. Chronic headaches.   MENTAL STATUS EXAMINATION: Slightly disheveled woman, looks her stated age, cooperative with the interview. Good eye contact. Normal psychomotor activity. Speech is normal in rate, tone and volume. Affect is appropriate and tearful at times. Mood is stated as being bad. Thoughts are lucid without any sign of loosening of associations or delusions. Denies any homicidal ideation. Has suicidal ideation without intent or plan. Judgment and insight good. Alert and oriented. Normal intelligence.   LABORATORY RESULTS: Drug screen is all negative, salicylates and acetaminophen negative. Pregnancy test negative. Urinalysis unremarkable. TSH normal. Alcohol undetected. Chemistry and CBC unremarkable.  ASSESSMENT: A 34 year old woman with bipolar disorder who has been off of her medicine. Increasing presence of symptoms including mood swings, depressed mood, suicidal ideation and hallucinations. The patient requires inpatient treatment at this time.   TREATMENT PLAN:  Unfortunately we have no beds available on the psychiatry ward. The patient understands this. We will restart treatment as she has had it previously with Depakote Risperdal and Restoril as well as restarting her blood pressure medicine. We will continually re-evaluate her. If a bed opens up while she still needs inpatient treatment,  we can choose to admit her. I will see also if someone can possibly help her with getting her Medicaid restarted.   DIAGNOSIS, PRINCIPAL AND PRIMARY:   AXIS I: Bipolar disorder type I, mixed episode.   SECONDARY DIAGNOSES: AXIS I: No further diagnosis.   AXIS II: No diagnosis.   AXIS III: Overweight, high blood pressure, chronic headaches.   AXIS IV: Severe from financial strain, raising children, single motherhood.   AXIS V: Functioning at time of evaluation 30.    ____________________________ Gonzella Lex, MD jtc:cc D: 06/27/2013 23:06:35 ET T: 06/27/2013 23:47:08 ET JOB#: [id number]  cc: Gonzella Lex, MD, <Dictator> Gonzella Lex MD ELECTRONICALLY SIGNED 06/28/2013 12:39

## 2015-02-15 NOTE — Consult Note (Signed)
Referral Information:  Prenatal Hx Maria Dean is a 34 year-old G4 P3 at 7 weeks who presented today to [facility] for Genetic Counseling due to an increased risk for Down Syndrome by multiple marker screen. She had a normal anat scan with [place] on 05/20/14.  At the time of her anatomy scan her blood pressure was elevated at 181/95. She was sent to [facility] L&D and her preeclmapsia evaluation was negative. Her care was transferred to [facility] high-risk Ob but she did not keep her appointment. It has been rescheduled for this Thursday, August 20.  She is taking Procardia 30 mg XL daily. She states she took it today. She denies headache, blurry vision, or swelling.  Today: Wt: 304 lbs (303 on 05/20/14) BP: 190/106, 181/96 Urine dip: trace protein LE: no edema Denies abdominal pain.  Findings discussed. Recommned that patient be evaluated for preeclampsia/worsening HTN on L&D at [facility]. I called the CNM covering [facility] and she is aware Pt to go to [facility] L&D. Driving instructions given. If discharged, has f/u appt with Dr. Etter Sjogren on Thursday Aug 20. [place] report from 05/20/14 and [facility] labs from 05/20/14 faxed to [facility] L&D [facility] done today. [place] testing sent.   Allergies:   Orphenadrine: N/V/Diarrhea   >50% of visit spent in couseling/coordination of care yes   Office Use Only no charge   Coding Description: MATERNAL CONDITIONS/HISTORY INDICATION(S).   HTN - Chronic.  Electronic Signatures: Derris Millan, [place] (MD)  (Signed 17-Aug-15 11:34)  Authored: Referral, Allergies, Billing, Coding Description   Last Updated: 17-Aug-15 11:34 by Cirilo Canner, [place] (MD)

## 2015-02-15 NOTE — Consult Note (Signed)
PATIENT NAME:  KINJAL, NEITZKE MR#:  [id number] DATE OF BIRTH:  1981-09-07  DATE OF CONSULTATION:  11/21/2013  REFERRING PHYSICIAN:   CONSULTING PHYSICIAN:  Shanie Mauzy A. Krisha Beegle, MD  REASON FOR CONSULTATION:  Gallstone on ultrasound.   HISTORY OF PRESENT ILLNESS:  Ms. Tatum is a pleasant 34 year old female with history of hypertension and presents with one days of myalgias.  She reported that her entire body hurts.  This is attributed to her flu shot two weeks ago, also with some mid upper back pain.  No abdominal pain reported, but says that she is tender when people push on her abdomen.  No fevers, chills, night sweats, shortness of breath, cough, chest pain, abdominal pain, nausea, vomiting, diarrhea, constipation.  Last bowel movement today.  Has not been eating because she feels weak.  No dysuria, hematuria.   PAST MEDICAL HISTORY: 1.  Hypertension.  2.  Bipolar.  3.  Migraines.   OUTPATIENT MEDICATIONS:  None.   ALLERGIES:  Orphenadrine.   SOCIAL HISTORY:  Lives in [place].  No tobacco use.  Social alcohol use.   FAMILY HISTORY:  Denies a family history of diabetes, heart problems, cancer.   REVIEW OF SYSTEMS:  A 12 point review of systems obtained.  Pertinent positives and negatives as above.   PHYSICAL EXAMINATION:  VITAL SIGNS:  Temperature 97.8, pulse 94, blood pressure 170/111, respirations 18.  GENERAL:  No acute distress, alert and oriented x 3.  HEAD:  Normocephalic, atraumatic. EYES:  No scleral icterus.  No conjunctivitis.  FACE:  No obvious facial trauma.  Normal external nose.  Normal external ears.  CHEST:  Lungs clear to auscultation.  Moving air well.  HEART:  Regular rate and rhythm.  No murmurs, rubs or gallops. BACK:  Does have tenderness in her upper spine to palpation and the peri-spinomuscular tissue.  ABDOMEN:  Soft, tender to palpation diffusely.  No focal tenderness.  EXTREMITIES:  Moves extremities well.  Strength 5 out of 5.  NEUROLOGIC:   Cranial nerves II through XII grossly intact.  Sensation intact to all four extremities.   LABORATORY DATA:  White cell count of 12.2, neutrophils 59%, AST is 56, potassium is 5.6.   Ultrasound shows a large mobile stone.  No pericholecystic fluid.  No gallbladder wall thickening.   ASSESSMENT AND PLAN:  Ms. Townsel is a pleasant 34 year old with diffuse myalgias and gallstone, unlikely cholecystitis or symptomatic cholelithiasis.  Favor viral illness.  No obvious surgical issues.     ____________________________ Glena Norfolk Jazlene Bares, MD cal:ea D: 11/21/2013 00:50:08 ET T: 11/21/2013 01:51:34 ET JOB#: [id number]  cc: Harrell Gave A. Azarya Oconnell, MD, <Dictator> Floyde Parkins MD ELECTRONICALLY SIGNED 11/26/2013 8:26

## 2015-03-04 NOTE — H&P (Signed)
L&D Evaluation:  History Expanded:  HPI 34 yo G4P3 at 23 week 3 days, who was sent to l and from the [facility] for eleveated BP of 180/100. This is not an unusaul BP for the patient as her medcial record on 04/25/14 at the [facility] showas a BP of 170/100/ SHe has no history of elevated BP and she is morbidly obese with a BMI of 52. She has had 3 SVD n the past. her first [place] was 03/04/2014 but no BP is available from that vivist. pt is also a drinkwer of ETOH during pregnanacy last tome 02/22/14. Bp at 20 weeks of pregnancy was 140/80 with 2+ protein. no wprk up done at that time. At that time she was wakling up every day with a HA currwently patient is drinkin about 32 oz of coke a day. pt had trich at 20 weeks and was treated with flagyl 2 gms po, pt is A pos/RI/VI/  papa ASC [place] pos HPV. she has a history of depression, she has had a hx of migraines, NKDA   Gravida 4   Term 3   PreTerm 0   Abortion 0   Living 3   Blood Type (Maternal) A positive   Group B Strep Results Maternal (Result >5wks must be treated as unknown) unknown/result > 5 weeks ago    Maternal HIV Negative   Maternal Syphilis Ab Nonreactive   Maternal Varicella Immune   Rubella Results (Maternal) immune   Maternal T-Dap Unknown   [facility] 10-Sep-2014   Presents with elevated BP   Patient's Medical History Hypertension  headaches    Patient's Surgical History none    Medications Pre Natal Vitamins  has trouble swallowing pills as they get down and she throws it up    Allergies NKDA   Social History EtOH    Family History Non-Contributory  chtn    Current Prenatal Course Notable For Morbid Obesity  late PNC, CHTN unitntreated    ROS:  ROS All systems were reviewed.  HEENT, CNS, GI, GU, Respiratory, CV, Renal and Musculoskeletal systems were found to be normal.   Exam:  Vital Signs BP >140/90    Urine Protein trace   General no apparent distress, lethargic   Mental Status clear    Chest clear    Abdomen gravid,  non-tender   Estimated Fetal Weight Small for gestational age   Back no CVAT   Edema no edema    Reflexes 1+    Clonus positive   Pelvic no external lesions, cervix closed and thick, clo   Mebranes Intact   FHT normal rate with no decels, cat 2 only 23 weeks,.   Fetal Heart Rate 145    Ucx absent   Skin dry   Lymph no lymphadenopathy    Other has headache but always ahas a headache   Impression:  Impression morbid obesity, chtn, headchae from bp.   Plan:  Plan UA, monitor BP, PIH panel, uds, 24 hr urine tot protein   Comments pt threw up the labetalol as she can not keep pills down and says she will not be taking pills if we give to her. explained the importance, of keeping BP down to preserve kidney fucntion and need for dialysis with in the year. she doe not seem to care. will try procardia with capsule and coating so that she tolerates iot better or coat ot on peanut butter. She is obviously able to eat as she is morbidly obese. She is doing a  24 hour tot urine protein to get a baseline and this was explained to her she states she has no way of getting it back to [place]. so we will keep her overnight to complete it and she can go home after. her CR is 0.66, her uric acid is 4.5, her potassium is low but she will not take pills, so will give IV k if it gets any lower. HEr serial BPs ion bed are very good at 022-336 systolic, she can not stay in bed. so we will try the procardia and watch her over night.   Electronic Signatures: Erik Obey (MD)  (Signed 23-Jul-15 21:13)  Authored: L&D Evaluation   Last Updated: 23-Jul-15 21:13 by Erik Obey (MD)

## 2015-03-06 ENCOUNTER — Emergency Department: Payer: Medicaid Other

## 2015-03-06 ENCOUNTER — Emergency Department
Admission: EM | Admit: 2015-03-06 | Discharge: 2015-03-07 | Disposition: A | Discharge status: Home / Self Care | Payer: Medicaid Other | Attending: Emergency Medicine | Admitting: Emergency Medicine

## 2015-03-06 ENCOUNTER — Encounter: Payer: Self-pay | Admitting: *Deleted

## 2015-03-06 ENCOUNTER — Other Ambulatory Visit: Payer: Self-pay

## 2015-03-06 DIAGNOSIS — I10 Essential (primary) hypertension: Secondary | ICD-10-CM | POA: Insufficient documentation

## 2015-03-06 DIAGNOSIS — R109 Unspecified abdominal pain: Secondary | ICD-10-CM | POA: Insufficient documentation

## 2015-03-06 DIAGNOSIS — Z3202 Encounter for pregnancy test, result negative: Secondary | ICD-10-CM | POA: Insufficient documentation

## 2015-03-06 HISTORY — DX: Essential (primary) hypertension: I10

## 2015-03-06 LAB — CBC WITH DIFFERENTIAL/PLATELET
Basophils Absolute: 0.1 10*3/uL (ref 0–0.1)
Basophils Relative: 0 %
EOS ABS: 0.2 10*3/uL (ref 0–0.7)
EOS PCT: 2 %
HEMATOCRIT: 36.2 % (ref 35.0–47.0)
Hemoglobin: 12.1 g/dL (ref 12.0–16.0)
LYMPHS ABS: 3.6 10*3/uL (ref 1.0–3.6)
LYMPHS PCT: 24 %
MCH: 26.7 pg (ref 26.0–34.0)
MCHC: 33.5 g/dL (ref 32.0–36.0)
MCV: 79.7 fL — ABNORMAL LOW (ref 80.0–100.0)
Monocytes Absolute: 0.9 10*3/uL (ref 0.2–0.9)
Monocytes Relative: 6 %
Neutro Abs: 10.2 10*3/uL — ABNORMAL HIGH (ref 1.4–6.5)
Neutrophils Relative %: 68 %
Platelets: 377 10*3/uL (ref 150–440)
RBC: 4.53 MIL/uL (ref 3.80–5.20)
RDW: 15.9 % — ABNORMAL HIGH (ref 11.5–14.5)
WBC: 15 10*3/uL — AB (ref 3.6–11.0)

## 2015-03-06 LAB — COMPREHENSIVE METABOLIC PANEL
ALK PHOS: 86 U/L (ref 38–126)
ALT: 29 U/L (ref 14–54)
AST: 22 U/L (ref 15–41)
Albumin: 3.7 g/dL (ref 3.5–5.0)
Anion gap: 6 (ref 5–15)
BILIRUBIN TOTAL: 0.6 mg/dL (ref 0.3–1.2)
BUN: 15 mg/dL (ref 6–20)
CHLORIDE: 105 mmol/L (ref 101–111)
CO2: 27 mmol/L (ref 22–32)
Calcium: 9.1 mg/dL (ref 8.9–10.3)
Creatinine, Ser: 1.3 mg/dL — ABNORMAL HIGH (ref 0.44–1.00)
GFR, EST NON AFRICAN AMERICAN: 53 mL/min — AB (ref >60)
GLUCOSE: 91 mg/dL (ref 65–99)
POTASSIUM: 3.6 mmol/L (ref 3.5–5.1)
SODIUM: 138 mmol/L (ref 135–145)
Total Protein: 8.2 g/dL — ABNORMAL HIGH (ref 6.5–8.1)

## 2015-03-06 LAB — URINALYSIS COMPLETE WITH MICROSCOPIC (ARMC ONLY)
Bacteria, UA: NONE SEEN
Bilirubin Urine: NEGATIVE
GLUCOSE, UA: NEGATIVE mg/dL
Hgb urine dipstick: NEGATIVE
Ketones, ur: NEGATIVE mg/dL
LEUKOCYTES UA: NEGATIVE
Nitrite: NEGATIVE
Protein, ur: 100 mg/dL — AB
Specific Gravity, Urine: 1.027 (ref 1.005–1.030)
pH: 6 (ref 5.0–8.0)

## 2015-03-06 LAB — PREGNANCY, URINE: PREG TEST UR: NEGATIVE

## 2015-03-06 MED ORDER — HYDROMORPHONE HCL 1 MG/ML IJ SOLN
1.0000 mg | Freq: Once | INTRAMUSCULAR | Status: AC
  Administered 2015-03-06: 1 mg via INTRAVENOUS

## 2015-03-06 MED ORDER — HYDROMORPHONE HCL 1 MG/ML IJ SOLN
INTRAMUSCULAR | Status: AC
  Administered 2015-03-06: 1 mg via INTRAVENOUS
  Filled 2015-03-06: qty 1

## 2015-03-06 MED ORDER — ONDANSETRON HCL 4 MG/2ML IJ SOLN
INTRAMUSCULAR | Status: AC
  Administered 2015-03-06: 4 mg via INTRAVENOUS
  Filled 2015-03-06: qty 2

## 2015-03-06 MED ORDER — HYDROMORPHONE HCL 1 MG/ML IJ SOLN
INTRAMUSCULAR | Status: AC
  Filled 2015-03-06: qty 1

## 2015-03-06 MED ORDER — OXYCODONE-ACETAMINOPHEN 5-325 MG PO TABS: 1.0000 | ORAL_TABLET | ORAL | Status: DC | PRN

## 2015-03-06 MED ORDER — ONDANSETRON HCL 4 MG/2ML IJ SOLN
4.0000 mg | Freq: Once | INTRAMUSCULAR | Status: AC
  Administered 2015-03-06: 4 mg via INTRAVENOUS

## 2015-03-06 NOTE — ED Provider Notes (Signed)
[facility] Emergency Department Provider Note    Time seen: 8:30 PM  I have reviewed the triage vital signs and the nursing notes.   HISTORY  Chief Complaint Abdominal Pain    HPI Maria Dean is a 34 y.o. female 's ER for right flank pain and abdominal pain goes down her right groin since Sunday has had nausea and vomiting since Monday only urinated twice today. It is severe sharp right side. Nothing makes it better movement or touching the back at all makes it worse.    Past Medical History  Diagnosis Date  . Hypertension     There are no active problems to display for this patient.   Past Surgical History  Procedure Laterality Date  . Cesarean section      No current outpatient prescriptions on file.  Allergies Review of patient's allergies indicates not on file.  No family history on file.  Social History History  Substance Use Topics  . Smoking status: Never Smoker   . Smokeless tobacco: Not on file  . Alcohol Use: No    Review of Systems Constitutional: Negative for fever. Eyes: Negative for visual changes. ENT: Negative for sore throat. Cardiovascular: Negative for chest pain. Respiratory: Negative for shortness of breath. Gastrointestinal: Positive for abdominal pain, negative for vomiting and diarrhea. Genitourinary: Infrequent urination Musculoskeletal: Severe right lower back pain Skin: Negative for rash. Neurological: Negative for headaches, focal weakness or numbness.  10-point ROS otherwise negative.  ____________________________________________   PHYSICAL EXAM:  VITAL SIGNS: ED Triage Vitals  Enc Vitals Group     BP 03/06/15 1931 211/126 mmHg     Pulse Rate 03/06/15 1931 101     Resp 03/06/15 1931 18     Temp 03/06/15 1931 98.3 F (36.8 C)     Temp Source 03/06/15 1931 Oral     SpO2 03/06/15 1931 100 %     Weight 03/06/15 1931 205 lb (92.987 kg)     Height 03/06/15 1931 5\' 5"  (1.651 m)     Head  Cir --      Peak Flow --      Pain Score 03/06/15 1932 9     Pain Loc --      Pain Edu? --      Excl. in [place]? --     Constitutional: Alert and oriented. Well appearing and in no distress. Eyes: Conjunctivae are normal. PERRL. Normal extraocular movements. ENT   Head: Normocephalic and atraumatic.   Nose: No congestion/rhinnorhea.   Mouth/Throat: Mucous membranes are moist.   Neck: No stridor. Hematological/Lymphatic/Immunilogical: No cervical lymphadenopathy. Cardiovascular: Normal rate, regular rhythm. Normal and symmetric distal pulses are present in all extremities. No murmurs, rubs, or gallops. Respiratory: Normal respiratory effort without tachypnea nor retractions. Breath sounds are clear and equal bilaterally. No wheezes/rales/rhonchi. Gastrointestinal: Soft and nontender. No distention. No abdominal bruits. There is no CVA tenderness. Musculoskeletal: Severe tenderness in the right flank out of proportion to examination. Severe pain noted in the right flank and hip area with range of motion right leg. Possible positive straight leg raise examination Neurologic:  Normal speech and language. No gross focal neurologic deficits are appreciated. Speech is normal. No gait instability. Skin:  Skin is warm, dry and intact. No rash noted. Psychiatric: Mood and affect are normal. Speech and behavior are normal. Patient exhibits appropriate insight and judgment.  ____________________________________________    LABS (pertinent positives/negatives)  Labs Reviewed  CBC WITH DIFFERENTIAL/PLATELET - Abnormal; Notable for the following:  WBC 15.0 (*)    MCV 79.7 (*)    RDW 15.9 (*)    Neutro Abs 10.2 (*)    All other components within normal limits  COMPREHENSIVE METABOLIC PANEL - Abnormal; Notable for the following:    Creatinine, Ser 1.30 (*)    Total Protein 8.2 (*)    GFR calc non Af Amer 53 (*)    All other components within normal limits  URINALYSIS COMPLETEWITH  MICROSCOPIC ([facility])  - Abnormal; Notable for the following:    Color, Urine YELLOW (*)    APPearance CLEAR (*)    Protein, ur 100 (*)    Squamous Epithelial / LPF 0-5 (*)    All other components within normal limits  PREGNANCY, URINE     ____________________________________________    RADIOLOGY  CT abdomen and pelvis  ____________________________________________    ED COURSE  Pertinent labs & imaging results that were available during my care of the patient were reviewed by me and considered in my medical decision making (see chart for details).  Patient received IV fluid IV Dilaudid, we'll CT on pelvis. Possible renal colic versus sciatica  FINAL ASSESSMENT AND PLAN  Flank pain    Earleen Newport, MD   Earleen Newport, MD 03/06/15 2159

## 2015-03-06 NOTE — ED Notes (Signed)
Pt reports right flank and abdominal pain that goes through her right groin since Sunday, has had n/v since Monday, only urinated x 2 today.

## 2015-03-06 NOTE — ED Provider Notes (Signed)
Patient's CT is negative she has any 1.3 and 6-30 red cells in her urine on reexamination she has exquisite pain to light touch she's brushing my fingertips over her flank this pain radiates from the flank down into her leg the patient is sitting bent at about and 80 with her legs bent at about 80 angle to the rest of her body and elevating the leg slightly more causes pain to go down from the buttocks into the knee but putting the leg down really does not cause rubs or really makes the pain get better so it does not appear that she really has classic straight leg raising does not have any weakness or numbness in the leg but complains of severe pain on weight bearing the pain runs up the leg into the flank pain patient cannot lay back against the bed because it hurts so much she has not been having fever I do not see a reason for the patient's pain although she could be having pain and that will develop into shingles I discussed that with her and advised her to watch out for the rash return if she has a rash she is somewhat worried because she has a 106-month-old preemie at home I told her if shingles does develop she will need to not be near the baby she will also have to notify her pediatrician immediately she needs to return for for vomiting fever or chills increasing pain or if the pain is not getting better after 2 days I will discharge her on Percocet for the pain again she has no fever she has no dysuria urgency frequency and does did not realize she had some red cells in her urine  Nena Polio, MD 03/06/15 2348

## 2015-03-06 NOTE — Discharge Instructions (Signed)
Please return for worse pain, fever, vomiting or if no better in 2 days. (or follow up with your doctor).  Check carefully for a rash anywhere along the area of the pain. If a rash develops return here immediately and we can begin treatment for shingles or see your doctor. Also if you develop a rash and make sure you tell your pediatrician immediately so very close eye can be Your Baby and I Would Avoid Any Contact with Her until the Pediatrician Tells You Otherwise I Will Give You a Prescription for Percocet Take One or 2 Pills Every 6 Hours If Needed for the Pain Not Take Any Extra Tylenol with the Percocet As There Is Tylenol in the Percocet Already

## 2015-04-22 ENCOUNTER — Emergency Department: Payer: Medicaid Other

## 2015-04-22 ENCOUNTER — Encounter: Payer: Self-pay | Admitting: Emergency Medicine

## 2015-04-22 ENCOUNTER — Emergency Department
Admission: EM | Admit: 2015-04-22 | Discharge: 2015-04-22 | Disposition: A | Discharge status: Home / Self Care | Payer: Medicaid Other | Attending: Emergency Medicine | Admitting: Emergency Medicine

## 2015-04-22 DIAGNOSIS — Y998 Other external cause status: Secondary | ICD-10-CM | POA: Diagnosis not present

## 2015-04-22 DIAGNOSIS — Y9289 Other specified places as the place of occurrence of the external cause: Secondary | ICD-10-CM | POA: Diagnosis not present

## 2015-04-22 DIAGNOSIS — S8991XA Unspecified injury of right lower leg, initial encounter: Secondary | ICD-10-CM | POA: Diagnosis present

## 2015-04-22 DIAGNOSIS — I10 Essential (primary) hypertension: Secondary | ICD-10-CM | POA: Insufficient documentation

## 2015-04-22 DIAGNOSIS — Z79899 Other long term (current) drug therapy: Secondary | ICD-10-CM | POA: Diagnosis not present

## 2015-04-22 DIAGNOSIS — Y9389 Activity, other specified: Secondary | ICD-10-CM | POA: Insufficient documentation

## 2015-04-22 DIAGNOSIS — X58XXXA Exposure to other specified factors, initial encounter: Secondary | ICD-10-CM | POA: Diagnosis not present

## 2015-04-22 DIAGNOSIS — S86911A Strain of unspecified muscle(s) and tendon(s) at lower leg level, right leg, initial encounter: Secondary | ICD-10-CM | POA: Diagnosis not present

## 2015-04-22 MED ORDER — TRAMADOL HCL 50 MG PO TABS: 50.0000 mg | ORAL_TABLET | Freq: Four times a day (QID) | ORAL | Status: DC | PRN

## 2015-04-22 NOTE — ED Notes (Signed)
Pt to ed with c/o right knee pain after playing with her daughter this am in the floor.  Pt states she heard and felt "pop".  Pt states she is also concerned about her blood pressure as it has been elevated.  Today at triage BP was 167/98.  She states she has been feeling dizzy at times with blood pressure.  Per pt, she has appt with MD tomorrow.

## 2015-04-22 NOTE — ED Notes (Signed)
AAOx3.  Skin warm and dry.  NAD.  Ambulates with easy and steady gait.  Discharge home.

## 2015-04-22 NOTE — Discharge Instructions (Signed)
Your x-ray looked okay, no fracture, but there is a small joint effusion in the knee consistent with a knee strain or other injury.  He may take ibuprofen, 800 mg 3 times a day, for inflammation and discomfort. Follow-up with your doctor tomorrow as planned. Follow up with orthopedics next week.  Joint Sprain A sprain is a tear or stretch in the ligaments that hold a joint together. Severe sprains may need as long as 3-6 weeks of immobilization and/or exercises to heal completely. Sprained joints should be rested and protected. If not, they can become unstable and prone to re-injury. Proper treatment can reduce your pain, shorten the period of disability, and reduce the risk of repeated injuries. TREATMENT   Rest and elevate the injured joint to reduce pain and swelling.  Apply ice packs to the injury for 20-30 minutes every 2-3 hours for the next 2-3 days.  Keep the injury wrapped in a compression bandage or splint as long as the joint is painful or as instructed by your caregiver.  Do not use the injured joint until it is completely healed to prevent re-injury and chronic instability. Follow the instructions of your caregiver.  Long-term sprain management may require exercises and/or treatment by a physical therapist. Taping or special braces may help stabilize the joint until it is completely better. SEEK MEDICAL CARE IF:   You develop increased pain or swelling of the joint.  You develop increasing redness and warmth of the joint.  You develop a fever.  It becomes stiff.  Your hand or foot gets cold or numb. Document Released: 11/18/2004 Document Revised: 01/03/2012 Document Reviewed: 10/28/2008 [facility] Patient Information 2015 [place], [place]. This information is not intended to replace advice given to you by your health care provider. Make sure you discuss any questions you have with your health care provider.

## 2015-04-22 NOTE — ED Provider Notes (Signed)
[facility] Emergency Department Provider Note  ____________________________________________  Time seen: 11:30 AM  I have reviewed the triage vital signs and the nursing notes.   HISTORY  Chief Complaint Knee Pain  right knee    HPI Maria Dean is a 34 y.o. female who is crawling on the floor playing with her 74-month-old child today when she felt a pop and had acute onset of pain. The pain was severe for proximally 10 minutes and then began to ease up. She did shower and took some ibuprofen and the pain continued to ease to the point that it was no longer nagging but still notable. She hurts more with any additional movement. She denies any swelling.   Past Medical History  Diagnosis Date  . Hypertension     There are no active problems to display for this patient.   Past Surgical History  Procedure Laterality Date  . Cesarean section      Current Outpatient Rx  Name  Route  Sig  Dispense  Refill  . NIFEdipine (PROCARDIA XL/ADALAT-CC) 60 MG 24 hr tablet   Oral   Take 60 mg by mouth daily.         . sertraline (ZOLOFT) 50 MG tablet   Oral   Take 50 mg by mouth 2 (two) times daily.         Marland Kitchen oxyCODONE-acetaminophen (ROXICET) 5-325 MG per tablet   Oral   Take 1 tablet by mouth every 4 (four) hours as needed for severe pain. Patient not taking: Reported on 04/22/2015   20 tablet   0   . traMADol (ULTRAM) 50 MG tablet   Oral   Take 1 tablet (50 mg total) by mouth every 6 (six) hours as needed.   20 tablet   0     Allergies Orphenadrine and Ephedrine  History reviewed. No pertinent family history.  Social History History  Substance Use Topics  . Smoking status: Never Smoker   . Smokeless tobacco: Not on file  . Alcohol Use: No    Review of Systems  Constitutional: Negative for fever. ENT: Negative for sore throat. Cardiovascular: Negative for chest pain. History notable for hypertension. Respiratory: Negative for  shortness of breath. Gastrointestinal: Negative for abdominal pain, vomiting and diarrhea. Genitourinary: Negative for dysuria. Musculoskeletal: Pain right knee. See history of present illness Skin: Negative for rash. Neurological: Negative for headaches   10-point ROS otherwise negative.  ____________________________________________   PHYSICAL EXAM:  VITAL SIGNS: ED Triage Vitals  Enc Vitals Group     BP 04/22/15 1052 167/98 mmHg     Pulse Rate 04/22/15 1052 99     Resp 04/22/15 1052 20     Temp 04/22/15 1052 97.2 F (36.2 C)     Temp Source 04/22/15 1052 Oral     SpO2 04/22/15 1052 100 %     Weight 04/22/15 1052 315 lb (142.883 kg)     Height 04/22/15 1052 5\' 6"  (1.676 m)     Head Cir --      Peak Flow --      Pain Score 04/22/15 1052 6     Pain Loc --      Pain Edu? --      Excl. in [place]? --     Constitutional:  Alert and oriented. Well appearing and in no distress. ENT   Head: Normocephalic and atraumatic.   Nose: No congestion/rhinnorhea. Cardiovascular: Normal rate, regular rhythm, no murmur noted Respiratory:  Normal respiratory effort,  no tachypnea.    Breath sounds are clear and equal bilaterally.  Gastrointestinal: Soft and nontender. No distention.  Musculoskeletal: No deformity noted. Notable point tenderness over the patella. The patella is in the normal location and has no crepitus. No deformity no instability. The patient has limited range of motion due to pain. She is able to flex the knee only to 15. Attempts at anterior drawer tests are not well tolerated. Neurologic:  Normal speech and language. No gross focal neurologic deficits are appreciated.  Skin:  Skin is warm, dry. No rash noted. Psychiatric: Mood and affect are normal. Speech and behavior are normal.  ____________________________________________   RADIOLOGY  Right knee: IMPRESSION: No acute osseous abnormality  identified.   ____________________________________________   INITIAL IMPRESSION / ASSESSMENT AND PLAN / ED COURSE  Pertinent labs & imaging results that were available during my care of the patient were reviewed by me and considered in my medical decision making (see chart for details).   Patient with injury to right knee. The mechanism is not consistent with a fracture or dislocation. It is more likely to be soft tissue injury, including possible ligamentous injury, although this is also less likely given the mechanism of injury.  We will obtain an x-ray of the knee. We have discussed the need for orthopedic follow-up.  ----------------------------------------- 12:34 PM on 04/22/2015 -----------------------------------------  X-ray without fracture but with mild effusion. I counseled the patient to follow up with orthopedics next week. We will prescribe Ultram for discomfort. We've advised her to take ibuprofen as well.  ____________________________________________   FINAL CLINICAL IMPRESSION(S) / ED DIAGNOSES  Final diagnoses:  Strain of right knee, initial encounter      Ahmed Prima, MD 04/22/15 1234

## 2015-05-14 DIAGNOSIS — F3131 Bipolar disorder, current episode depressed, mild: Secondary | ICD-10-CM | POA: Insufficient documentation

## 2015-07-09 ENCOUNTER — Emergency Department
Admission: EM | Admit: 2015-07-09 | Discharge: 2015-07-09 | Disposition: A | Discharge status: Home / Self Care | Payer: Medicaid Other | Attending: Student | Admitting: Student

## 2015-07-09 ENCOUNTER — Encounter: Payer: Self-pay | Admitting: Emergency Medicine

## 2015-07-09 DIAGNOSIS — I10 Essential (primary) hypertension: Secondary | ICD-10-CM | POA: Insufficient documentation

## 2015-07-09 DIAGNOSIS — R0981 Nasal congestion: Secondary | ICD-10-CM | POA: Diagnosis present

## 2015-07-09 DIAGNOSIS — J0100 Acute maxillary sinusitis, unspecified: Secondary | ICD-10-CM | POA: Diagnosis not present

## 2015-07-09 DIAGNOSIS — Z79899 Other long term (current) drug therapy: Secondary | ICD-10-CM | POA: Insufficient documentation

## 2015-07-09 MED ORDER — AMOXICILLIN 500 MG PO CAPS: 500.0000 mg | ORAL_CAPSULE | Freq: Three times a day (TID) | ORAL | Status: DC

## 2015-07-09 MED ORDER — FEXOFENADINE HCL 180 MG PO TABS: 180.0000 mg | ORAL_TABLET | Freq: Every day | ORAL | Status: DC

## 2015-07-09 NOTE — ED Notes (Signed)
Discharge instructions as well as prescribed medications reviewed with patient who verbalized understanding. Encouraged patient to return or see primary MD if symptoms worsen or do not improve with medication.

## 2015-07-09 NOTE — ED Notes (Signed)
Patient ambulatory to triage with steady gait, without difficulty or distress noted; pt reports x 3 days having sinus/facial pressure, pain to ears

## 2015-07-09 NOTE — ED Provider Notes (Signed)
[facility] Emergency Department Provider Note  ____________________________________________  Time seen: Approximately 7:06 AM  I have reviewed the triage vital signs and the nursing notes.   HISTORY  Chief Complaint Nasal Congestion and Facial Pain    HPI Maria Dean is a 34 y.o. female patient complain of sinus and facial pressure for 3 days. Patient also complaining of pain and pressure to bilateral ears. Patient denies any fever, or nausea vomiting diarrhea. Patient said relieve over-the-counter medications.   Past Medical History  Diagnosis Date  . Hypertension     There are no active problems to display for this patient.   Past Surgical History  Procedure Laterality Date  . Cesarean section      Current Outpatient Rx  Name  Route  Sig  Dispense  Refill  . amoxicillin (AMOXIL) 500 MG capsule   Oral   Take 1 capsule (500 mg total) by mouth 3 (three) times daily.   30 capsule   0   . fexofenadine (ALLEGRA) 180 MG tablet   Oral   Take 1 tablet (180 mg total) by mouth daily.   20 tablet   0   . NIFEdipine (PROCARDIA XL/ADALAT-CC) 60 MG 24 hr tablet   Oral   Take 60 mg by mouth daily.         Marland Kitchen oxyCODONE-acetaminophen (ROXICET) 5-325 MG per tablet   Oral   Take 1 tablet by mouth every 4 (four) hours as needed for severe pain. Patient not taking: Reported on 04/22/2015   20 tablet   0   . sertraline (ZOLOFT) 50 MG tablet   Oral   Take 50 mg by mouth 2 (two) times daily.         . traMADol (ULTRAM) 50 MG tablet   Oral   Take 1 tablet (50 mg total) by mouth every 6 (six) hours as needed.   20 tablet   0     Allergies Orphenadrine and Ephedrine  No family history on file.  Social History Social History  Substance Use Topics  . Smoking status: Never Smoker   . Smokeless tobacco: None  . Alcohol Use: No    Review of Systems Constitutional: No fever/chills Eyes: No visual changes. ENT: No sore throat. Sinus  pressure Cardiovascular: Denies chest pain. Respiratory: Denies shortness of breath. Gastrointestinal: No abdominal pain.  No nausea, no vomiting.  No diarrhea.  No constipation. Genitourinary: Negative for dysuria. Musculoskeletal: Negative for back pain. Skin: Negative for rash. Neurological: Negative for headaches, focal weakness or numbness. 10-point ROS otherwise negative.  ____________________________________________   PHYSICAL EXAM:  VITAL SIGNS: ED Triage Vitals  Enc Vitals Group     BP 07/09/15 0639 154/97 mmHg     Pulse Rate 07/09/15 0639 88     Resp 07/09/15 0639 20     Temp 07/09/15 0639 97.6 F (36.4 C)     Temp Source 07/09/15 0639 Oral     SpO2 07/09/15 0639 97 %     Weight 07/09/15 0639 300 lb (136.079 kg)     Height 07/09/15 0639 5\' 5"  (1.651 m)     Head Cir --      Peak Flow --      Pain Score 07/09/15 0640 7     Pain Loc --      Pain Edu? --      Excl. in [place]? --     Constitutional: Alert and oriented. Well appearing and in no acute distress. Eyes: Conjunctivae are  normal. PERRL. EOMI. Head: Atraumatic. Nose: Edematous bilateral nasal turbinates. Moderate guarding palpation maxillary sinuses. Mouth/Throat: Mucous membranes are moist.  Oropharynx non-erythematous. Postnasal drainage Neck: No stridor.   Cardiovascular: Normal rate, regular rhythm. Grossly normal heart sounds.  Good peripheral circulation. Elevated BP Respiratory: Normal respiratory effort.  No retractions. Lungs CTAB. Gastrointestinal: Soft and nontender. No distention. No abdominal bruits. No CVA tenderness. Musculoskeletal: No lower extremity tenderness nor edema.  No joint effusions. Neurologic:  Normal speech and language. No gross focal neurologic deficits are appreciated. No gait instability. Skin:  Skin is warm, dry and intact. No rash noted. Psychiatric: Mood and affect are normal. Speech and behavior are normal.  ____________________________________________   LABS (all labs  ordered are listed, but only abnormal results are displayed)  Labs Reviewed - No data to display ____________________________________________  EKG   ____________________________________________  RADIOLOGY   ____________________________________________   PROCEDURES  Procedure(s) performed: None  Critical Care performed: No  ____________________________________________   INITIAL IMPRESSION / ASSESSMENT AND PLAN / ED COURSE  Pertinent labs & imaging results that were available during my care of the patient were reviewed by me and considered in my medical decision making (see chart for details).  Maxillary sinusitis. There is a patient history allergies decongest is not advisable at this time. Patient will be placed on Allegra and amoxicillin. Patient advised to take Tylenol or Motrin for headache and pressure pain. Patient does follow-up 3-5 days with family doctor is no improvement or worsening of her complaint. ____________________________________________   FINAL CLINICAL IMPRESSION(S) / ED DIAGNOSES  Final diagnoses:  Acute maxillary sinusitis, recurrence not specified      Sable Feil, PA-C 07/09/15 5537  Joanne Gavel, MD 07/09/15 [phone]

## 2015-08-13 ENCOUNTER — Emergency Department: Payer: Medicaid Other

## 2015-08-13 ENCOUNTER — Observation Stay
Admission: EM | Admit: 2015-08-13 | Discharge: 2015-08-15 | Disposition: A | Discharge status: Home / Self Care | Payer: Medicaid Other | Attending: Internal Medicine | Admitting: Internal Medicine

## 2015-08-13 ENCOUNTER — Encounter: Payer: Self-pay | Admitting: *Deleted

## 2015-08-13 DIAGNOSIS — G43909 Migraine, unspecified, not intractable, without status migrainosus: Secondary | ICD-10-CM | POA: Diagnosis not present

## 2015-08-13 DIAGNOSIS — Z79899 Other long term (current) drug therapy: Secondary | ICD-10-CM | POA: Insufficient documentation

## 2015-08-13 DIAGNOSIS — Z79891 Long term (current) use of opiate analgesic: Secondary | ICD-10-CM | POA: Insufficient documentation

## 2015-08-13 DIAGNOSIS — Z888 Allergy status to other drugs, medicaments and biological substances status: Secondary | ICD-10-CM | POA: Diagnosis not present

## 2015-08-13 DIAGNOSIS — R112 Nausea with vomiting, unspecified: Secondary | ICD-10-CM | POA: Diagnosis not present

## 2015-08-13 DIAGNOSIS — R42 Dizziness and giddiness: Secondary | ICD-10-CM | POA: Diagnosis not present

## 2015-08-13 DIAGNOSIS — O903 Peripartum cardiomyopathy: Secondary | ICD-10-CM | POA: Diagnosis not present

## 2015-08-13 DIAGNOSIS — R202 Paresthesia of skin: Secondary | ICD-10-CM | POA: Insufficient documentation

## 2015-08-13 DIAGNOSIS — I10 Essential (primary) hypertension: Secondary | ICD-10-CM | POA: Diagnosis not present

## 2015-08-13 DIAGNOSIS — R0602 Shortness of breath: Secondary | ICD-10-CM | POA: Diagnosis not present

## 2015-08-13 DIAGNOSIS — R200 Anesthesia of skin: Secondary | ICD-10-CM

## 2015-08-13 DIAGNOSIS — R51 Headache: Secondary | ICD-10-CM

## 2015-08-13 DIAGNOSIS — K8020 Calculus of gallbladder without cholecystitis without obstruction: Secondary | ICD-10-CM | POA: Insufficient documentation

## 2015-08-13 DIAGNOSIS — R079 Chest pain, unspecified: Secondary | ICD-10-CM | POA: Diagnosis present

## 2015-08-13 DIAGNOSIS — H538 Other visual disturbances: Secondary | ICD-10-CM | POA: Diagnosis present

## 2015-08-13 DIAGNOSIS — R519 Headache, unspecified: Secondary | ICD-10-CM

## 2015-08-13 DIAGNOSIS — R072 Precordial pain: Secondary | ICD-10-CM | POA: Diagnosis not present

## 2015-08-13 LAB — CBC
HCT: 35.3 % (ref 35.0–47.0)
Hemoglobin: 11.8 g/dL — ABNORMAL LOW (ref 12.0–16.0)
MCH: 26.9 pg (ref 26.0–34.0)
MCHC: 33.5 g/dL (ref 32.0–36.0)
MCV: 80.2 fL (ref 80.0–100.0)
PLATELETS: 381 10*3/uL (ref 150–440)
RBC: 4.4 MIL/uL (ref 3.80–5.20)
RDW: 16.2 % — AB (ref 11.5–14.5)
WBC: 14 10*3/uL — ABNORMAL HIGH (ref 3.6–11.0)

## 2015-08-13 LAB — BASIC METABOLIC PANEL
ANION GAP: 7 (ref 5–15)
BUN: 13 mg/dL (ref 6–20)
CALCIUM: 9 mg/dL (ref 8.9–10.3)
CO2: 24 mmol/L (ref 22–32)
Chloride: 105 mmol/L (ref 101–111)
Creatinine, Ser: 0.75 mg/dL (ref 0.44–1.00)
GFR calc Af Amer: >60 mL/min (ref >60)
Glucose, Bld: 115 mg/dL — ABNORMAL HIGH (ref 65–99)
Potassium: 3.6 mmol/L (ref 3.5–5.1)
Sodium: 136 mmol/L (ref 135–145)

## 2015-08-13 LAB — TROPONIN I

## 2015-08-13 LAB — FIBRIN DERIVATIVES D-DIMER (ARMC ONLY): Fibrin derivatives D-dimer (ARMC): 750.4 — ABNORMAL HIGH (ref 0–499)

## 2015-08-13 MED ORDER — MORPHINE SULFATE (PF) 2 MG/ML IV SOLN: 2.0000 mg | INTRAVENOUS | Status: DC | PRN

## 2015-08-13 MED ORDER — SODIUM CHLORIDE 0.9 % IJ SOLN: 3.0000 mL | Freq: Two times a day (BID) | INTRAMUSCULAR | Status: DC

## 2015-08-13 MED ORDER — ACETAMINOPHEN 650 MG RE SUPP: 650.0000 mg | Freq: Four times a day (QID) | RECTAL | Status: DC | PRN

## 2015-08-13 MED ORDER — ONDANSETRON HCL 4 MG/2ML IJ SOLN
4.0000 mg | Freq: Once | INTRAMUSCULAR | Status: DC
  Filled 2015-08-13: qty 2

## 2015-08-13 MED ORDER — ASPIRIN EC 325 MG PO TBEC
325.0000 mg | DELAYED_RELEASE_TABLET | Freq: Once | ORAL | Status: AC
  Administered 2015-08-13: 325 mg via ORAL
  Filled 2015-08-13: qty 1

## 2015-08-13 MED ORDER — METOPROLOL TARTRATE 1 MG/ML IV SOLN
5.0000 mg | Freq: Once | INTRAVENOUS | Status: AC
  Administered 2015-08-13: 5 mg via INTRAVENOUS
  Filled 2015-08-13: qty 5

## 2015-08-13 MED ORDER — HYDRALAZINE HCL 20 MG/ML IJ SOLN: 10.0000 mg | INTRAMUSCULAR | Status: DC | PRN

## 2015-08-13 MED ORDER — ASPIRIN EC 81 MG PO TBEC
81.0000 mg | DELAYED_RELEASE_TABLET | Freq: Every day | ORAL | Status: DC
  Administered 2015-08-14 – 2015-08-15 (×2): 81 mg via ORAL
  Filled 2015-08-13 (×2): qty 1

## 2015-08-13 MED ORDER — HEPARIN SODIUM (PORCINE) 5000 UNIT/ML IJ SOLN
5000.0000 [IU] | Freq: Three times a day (TID) | INTRAMUSCULAR | Status: DC
  Administered 2015-08-13 – 2015-08-15 (×5): 5000 [IU] via SUBCUTANEOUS
  Filled 2015-08-13 (×5): qty 1

## 2015-08-13 MED ORDER — SODIUM CHLORIDE 0.9 % IV SOLN
INTRAVENOUS | Status: DC
  Administered 2015-08-13: 100 mL via INTRAVENOUS
  Administered 2015-08-14 (×3): via INTRAVENOUS

## 2015-08-13 MED ORDER — ACETAMINOPHEN 325 MG PO TABS
650.0000 mg | ORAL_TABLET | Freq: Four times a day (QID) | ORAL | Status: DC | PRN
  Administered 2015-08-14: 650 mg via ORAL
  Filled 2015-08-13: qty 2

## 2015-08-13 MED ORDER — ONDANSETRON HCL 4 MG/2ML IJ SOLN
4.0000 mg | Freq: Four times a day (QID) | INTRAMUSCULAR | Status: DC | PRN
Start: 2015-08-13 — End: 2015-08-15

## 2015-08-13 MED ORDER — ONDANSETRON HCL 4 MG/2ML IJ SOLN
4.0000 mg | Freq: Once | INTRAMUSCULAR | Status: AC
  Administered 2015-08-13: 4 mg via INTRAVENOUS
  Filled 2015-08-13: qty 2

## 2015-08-13 MED ORDER — MORPHINE SULFATE (PF) 4 MG/ML IV SOLN
4.0000 mg | Freq: Once | INTRAVENOUS | Status: AC
  Administered 2015-08-13: 4 mg via INTRAVENOUS
  Filled 2015-08-13: qty 1

## 2015-08-13 MED ORDER — OXYCODONE HCL 5 MG PO TABS
5.0000 mg | ORAL_TABLET | ORAL | Status: DC | PRN
  Administered 2015-08-14 (×2): 5 mg via ORAL
  Filled 2015-08-13 (×2): qty 1

## 2015-08-13 MED ORDER — NITROGLYCERIN 0.4 MG SL SUBL
0.4000 mg | SUBLINGUAL_TABLET | SUBLINGUAL | Status: DC | PRN
  Filled 2015-08-13: qty 1

## 2015-08-13 MED ORDER — ONDANSETRON HCL 4 MG PO TABS: 4.0000 mg | ORAL_TABLET | Freq: Four times a day (QID) | ORAL | Status: DC | PRN

## 2015-08-13 NOTE — ED Notes (Signed)
Pt reports while driving began to have blurred vision, tingling in left arm and chest pain radiating from right side to left side. Has been having similar episodes for past year.

## 2015-08-13 NOTE — ED Notes (Signed)
Called lab to get results of blood work.

## 2015-08-13 NOTE — ED Notes (Signed)
cae

## 2015-08-13 NOTE — H&P (Signed)
[place] [facility] [place] NAME: Maria Dean    MR#:  [id number]  DATE OF BIRTH:  11/27/1980   DATE OF ADMISSION:  08/13/2015  PRIMARY CARE PHYSICIAN:  [facility]  REQUESTING/REFERRING PHYSICIAN: Mariea Clonts  CHIEF COMPLAINT:   Chief Complaint  Patient presents with  . Chest Pain    HISTORY OF PRESENT ILLNESS:  Maria Dean  is a 34 y.o. female with a known history of essential hypertension, postpartum cardiomyopathy who is presenting with chest pain. She describes acute onset of chest pain retrosternal in location tightness in quality with associated blurred vision. She denies any shortness of breath or further symptomatology. Her chest pain is somewhat improved however vision remains somewhat of an issue, despite this he actually denies any headache. Upon arrival to the emergency department she was noted to be hypertensive 170s over 100s.  PAST MEDICAL HISTORY:   Past Medical History  Diagnosis Date  . Hypertension     PAST SURGICAL HISTORY:   Past Surgical History  Procedure Laterality Date  . Cesarean section      SOCIAL HISTORY:   Social History  Substance Use Topics  . Smoking status: Never Smoker   . Smokeless tobacco: Not on file  . Alcohol Use: No    FAMILY HISTORY:   Family History  Problem Relation Age of Onset  . Hypertension Other     DRUG ALLERGIES:   Allergies  Allergen Reactions  . Orphenadrine Shortness Of Breath  . Ephedrine Nausea And Vomiting    REVIEW OF SYSTEMS:  REVIEW OF SYSTEMS:  CONSTITUTIONAL: Denies fevers, chills, fatigue, weakness.  EYES: Positive blurred vision, denies double vision, or eye pain.  EARS, NOSE, THROAT: Denies tinnitus, ear pain, hearing loss.  RESPIRATORY: denies cough, shortness of breath, wheezing  CARDIOVASCULAR: Positive chest pain, denies palpitations, edema.  GASTROINTESTINAL: Denies nausea, vomiting, diarrhea, abdominal pain.  GENITOURINARY:  Denies dysuria, hematuria.  ENDOCRINE: Denies nocturia or thyroid problems. HEMATOLOGIC AND LYMPHATIC: Denies easy bruising or bleeding.  SKIN: Denies rash or lesions.  MUSCULOSKELETAL: Denies pain in neck, back, shoulder, knees, hips, or further arthritic symptoms.  NEUROLOGIC: Denies paralysis, paresthesias.  PSYCHIATRIC: Denies anxiety or depressive symptoms. Otherwise full review of systems performed by me is negative.   MEDICATIONS AT HOME:   Prior to Admission medications   Medication Sig Start Date End Date Taking? Authorizing Provider  metoprolol-hydrochlorothiazide (LOPRESSOR HCT) 100-25 MG tablet Take 1 tablet by mouth daily.   Yes Historical Provider, MD  Prenatal Vit-Fe Fumarate-FA (PRENATAL MULTIVITAMIN) TABS tablet Take 1 tablet by mouth at bedtime.   Yes Historical Provider, MD  amoxicillin (AMOXIL) 500 MG capsule Take 1 capsule (500 mg total) by mouth 3 (three) times daily. Patient not taking: Reported on 08/13/2015 07/09/15   Sable Feil, PA-C  fexofenadine (ALLEGRA) 180 MG tablet Take 1 tablet (180 mg total) by mouth daily. Patient not taking: Reported on 08/13/2015 07/09/15   Sable Feil, PA-C  oxyCODONE-acetaminophen (ROXICET) 5-325 MG per tablet Take 1 tablet by mouth every 4 (four) hours as needed for severe pain. Patient not taking: Reported on 04/22/2015 03/06/15   Nena Polio, MD  traMADol (ULTRAM) 50 MG tablet Take 1 tablet (50 mg total) by mouth every 6 (six) hours as needed. Patient not taking: Reported on 08/13/2015 04/22/15 04/21/16  Ahmed Prima, MD      VITAL SIGNS:  Blood pressure 176/105, pulse 104, temperature 98.1 F (36.7 C), temperature source Oral, resp.  rate 20, height 5\' 6"  (1.676 m), weight 310 lb (140.615 kg), SpO2 98 %.  PHYSICAL EXAMINATION:  VITAL SIGNS: Filed Vitals:   08/13/15 1939  BP: 176/105  Pulse:   Temp:   Resp:    GENERAL:34 y.o.female currently in no acute distress.  HEAD: Normocephalic, atraumatic.  EYES: Pupils  equal, round, reactive to light. Extraocular muscles intact. No scleral icterus.  MOUTH: Moist mucosal membrane. Dentition intact. No abscess noted.  EAR, NOSE, THROAT: Clear without exudates. No external lesions.  NECK: Supple. No thyromegaly. No nodules. No JVD.  PULMONARY: Clear to ascultation, without wheeze rails or rhonci. No use of accessory muscles, Good respiratory effort. good air entry bilaterally CHEST: Nontender to palpation.  CARDIOVASCULAR: S1 and S2. Regular rate and rhythm. No murmurs, rubs, or gallops. No edema. Pedal pulses 2+ bilaterally.  GASTROINTESTINAL: Soft, nontender, nondistended. No masses. Positive bowel sounds. No hepatosplenomegaly.  MUSCULOSKELETAL: No swelling, clubbing, or edema. Range of motion full in all extremities.  NEUROLOGIC: Cranial nerves II through XII are intact. No gross focal neurological deficits. Sensation intact. Reflexes intact.  SKIN: No ulceration, lesions, rashes, or cyanosis. Skin warm and dry. Turgor intact.  PSYCHIATRIC: Mood, affect within normal limits. The patient is awake, alert and oriented x 3. Insight, judgment intact.    LABORATORY PANEL:   CBC  Recent Labs Lab 08/13/15 1616  WBC 14.0*  HGB 11.8*  HCT 35.3  PLT 381   ------------------------------------------------------------------------------------------------------------------  Chemistries   Recent Labs Lab 08/13/15 1616  NA 136  K 3.6  CL 105  CO2 24  GLUCOSE 115*  BUN 13  CREATININE 0.75  CALCIUM 9.0   ------------------------------------------------------------------------------------------------------------------  Cardiac Enzymes  Recent Labs Lab 08/13/15 1930  TROPONINI <0.03   ------------------------------------------------------------------------------------------------------------------  RADIOLOGY:  Dg Chest 2 View  08/13/2015  CLINICAL DATA:  Pain.  Dizziness.  Intermittent shortness of breath. EXAM: CHEST  2 VIEW COMPARISON:  None.  FINDINGS: The heart size and mediastinal contours are within normal limits. Both lungs are clear. The visualized skeletal structures are unremarkable. IMPRESSION: No active cardiopulmonary disease. Electronically Signed   By: Kerby Moors M.D.   On: 08/13/2015 16:43    EKG:   Orders placed or performed during the hospital encounter of 08/13/15  . ED EKG within 10 minutes  . ED EKG within 10 minutes  . EKG 12-Lead  . EKG 12-Lead    IMPRESSION AND PLAN:   34 year old African-American female history of essential hypertension presenting with chest pain and blurred vision.  1. Hypertensive urgency: Continue home medications, add as needed hydralazine. She states this is been a recurrent issue which she is working on with her PCP Dr. Iona Beard 2. Chest pain, central: Telemetry, cardiac enzymes, aspirin 3. Venous return embolism prophylactic: Heparin subcutaneous     All the records are reviewed and case discussed with ED provider. Management plans discussed with the patient, family and they are in agreement.  CODE STATUS: Full  TOTAL TIME TAKING CARE OF THIS PATIENT: 35 minutes.    Hower,  Karenann Cai.D on 08/13/2015 at 10:33 PM  Between 7am to 6pm - Pager - [phone]  After 6pm: House Pager: - [phone]  [facility]  Office  [phone]  CC: Primary care physician; [place]

## 2015-08-13 NOTE — ED Notes (Signed)
ca

## 2015-08-13 NOTE — ED Provider Notes (Signed)
[facility] Emergency Department Provider Note  ____________________________________________  Time seen: Approximately 6:44 PM  I have reviewed the triage vital signs and the nursing notes.   HISTORY  Chief Complaint Chest Pain    HPI Maria Dean is a 34 y.o. female with a history of postpartum "enlarged heart" (no record of that in our system), HTN presenting with chest pain.Patient reports that shortly after 2 PM she was driving the car when she developed a substernal chest "tightness" associated with left arm tingling, and lightheaded feeling. This and proceeded to some "stars" in her vision and also then "blurred vision." She continues to have some mild chest tightness but the blurred vision has resolved.  Her pain is worse if she takes a deep breath.. She had skipped several meals and attributed her symptoms to this. She denies any recent changes in her medication or illness including fever, chills, nausea or vomiting, diarrhea. She has no history of hernia stress testing.   Past Medical History  Diagnosis Date  . Hypertension     There are no active problems to display for this patient.   Past Surgical History  Procedure Laterality Date  . Cesarean section      Current Outpatient Rx  Name  Route  Sig  Dispense  Refill  . amoxicillin (AMOXIL) 500 MG capsule   Oral   Take 1 capsule (500 mg total) by mouth 3 (three) times daily.   30 capsule   0   . fexofenadine (ALLEGRA) 180 MG tablet   Oral   Take 1 tablet (180 mg total) by mouth daily.   20 tablet   0   . NIFEdipine (PROCARDIA XL/ADALAT-CC) 60 MG 24 hr tablet   Oral   Take 60 mg by mouth daily.         Marland Kitchen oxyCODONE-acetaminophen (ROXICET) 5-325 MG per tablet   Oral   Take 1 tablet by mouth every 4 (four) hours as needed for severe pain. Patient not taking: Reported on 04/22/2015   20 tablet   0   . sertraline (ZOLOFT) 50 MG tablet   Oral   Take 50 mg by mouth 2 (two)  times daily.         . traMADol (ULTRAM) 50 MG tablet   Oral   Take 1 tablet (50 mg total) by mouth every 6 (six) hours as needed.   20 tablet   0     Allergies Orphenadrine and Ephedrine  No family history on file.  Social History Social History  Substance Use Topics  . Smoking status: Never Smoker   . Smokeless tobacco: None  . Alcohol Use: No    Review of Systems Constitutional: No fever/chills. Positive lightheadedness. No syncope. Eyes: No visual changes. ENT: No sore throat. Cardiovascular: Positive chest pain, no palpitations. Respiratory: Denies shortness of breath.  No cough. Gastrointestinal: No abdominal pain.  No nausea, no vomiting.  No diarrhea.  No constipation. Genitourinary: Negative for dysuria. Musculoskeletal: Negative for back pain. Skin: Negative for rash. Neurological: Negative for headaches, focal weakness or numbness.  10-point ROS otherwise negative.  ____________________________________________   PHYSICAL EXAM:  VITAL SIGNS: ED Triage Vitals  Enc Vitals Group     BP 08/13/15 1618 159/87 mmHg     Pulse Rate 08/13/15 1618 104     Resp 08/13/15 1618 20     Temp 08/13/15 1618 98.1 F (36.7 C)     Temp Source 08/13/15 1618 Oral     SpO2 08/13/15  1618 98 %     Weight 08/13/15 1618 310 lb (140.615 kg)     Height 08/13/15 1618 5\' 6"  (1.676 m)     Head Cir --      Peak Flow --      Pain Score 08/13/15 1612 7     Pain Loc --      Pain Edu? --      Excl. in [place]? --     Constitutional: Alert and oriented. Well appearing and in no acute distress. Answer question appropriately. Eyes: Conjunctivae are normal.  EOMI. Head: Atraumatic. Nose: No congestion/rhinnorhea. Mouth/Throat: Mucous membranes are moist.  Neck: No stridor.  Supple.   Cardiovascular: Normal rate, regular rhythm. No murmurs, rubs or gallops.  Respiratory: Normal respiratory effort.  No retractions. Lungs CTAB.  No wheezes, rales or ronchi. Gastrointestinal: Obese.  Soft and nontender. No distention. No peritoneal signs. Musculoskeletal: No LE edema. No palpable cords or calf tenderness. Neurologic:  Normal speech and language. No gross focal neurologic deficits are appreciated.  Skin:  Skin is warm, dry and intact. No rash noted. Psychiatric: Mood and affect are normal. Speech and behavior are normal.  Normal judgement.  ____________________________________________   LABS (all labs ordered are listed, but only abnormal results are displayed)  Labs Reviewed  BASIC METABOLIC PANEL - Abnormal; Notable for the following:    Glucose, Bld 115 (*)    All other components within normal limits  CBC - Abnormal; Notable for the following:    WBC 14.0 (*)    Hemoglobin 11.8 (*)    RDW 16.2 (*)    All other components within normal limits  TROPONIN I  TROPONIN I  FIBRIN DERIVATIVES D-DIMER ([facility] ONLY)   ____________________________________________  EKG  ED ECG REPORT I, Eula Listen, the attending physician, personally viewed and interpreted this ECG.   Date: 08/13/2015  EKG Time: 1613  Rate: 98  Rhythm: normal sinus rhythm  Axis: Normal  Intervals:none  ST&T Change: No ST elevation. Nonspecific T-wave inversion in V1. Minimal voltage criteria for LVH.  ____________________________________________  RADIOLOGY  Dg Chest 2 View  08/13/2015  CLINICAL DATA:  Pain.  Dizziness.  Intermittent shortness of breath. EXAM: CHEST  2 VIEW COMPARISON:  None. FINDINGS: The heart size and mediastinal contours are within normal limits. Both lungs are clear. The visualized skeletal structures are unremarkable. IMPRESSION: No active cardiopulmonary disease. Electronically Signed   By: Kerby Moors M.D.   On: 08/13/2015 16:43    ____________________________________________   PROCEDURES  Procedure(s) performed: None  Critical Care performed: No ____________________________________________   INITIAL IMPRESSION / ASSESSMENT AND PLAN / ED  COURSE  Pertinent labs & imaging results that were available during my care of the patient were reviewed by me and considered in my medical decision making (see chart for details).  34 y.o. female with nonexertional chest pain and vision changes. Overall, the patient is well-appearing. However does have some risk factors including hypertension, and what may have been a postpartum cardiomyopathy although this is unclear. On chest x-ray today, her cardiac silhouette is not enlarged. Her initial troponin from triage is negative, and her EKG does not show any ischemic changes. I will get a second troponin and then consider admission versus outpatient treatment with stress test. No evidence of acute CVA.  ----------------------------------------- 7:42 PM on 08/13/2015 -----------------------------------------  The patient remains mildly hypertensive, and we're waiting her medications to see if they improve her pain. Her labs are reassuring; she has a nonspecific elevated white  blood cell count but I do not think this is related to endocarditis, pericarditis. Chest x-ray does not show an enlarged heart or any other active cardiopulmonary disease. I will await the second troponin, if that was negative and she will be discharged with the appointment for an outpatient stress test.  ----------------------------------------- 8:56 PM on 08/13/2015 -----------------------------------------  Despite multiple medications, the patient continues to have some improved but still present chest pain with left arm tingling. She also continues to have blurred vision. I will plan to admit her to the hospitalist for further evaluation. ____________________________________________  FINAL CLINICAL IMPRESSION(S) / ED DIAGNOSES  Final diagnoses:  Chest pain, unspecified chest pain type  Blurred vision  Numbness and tingling in left arm  Non-intractable vomiting with nausea, vomiting of unspecified type      NEW  MEDICATIONS STARTED DURING THIS VISIT:  New Prescriptions   No medications on file     Eula Listen, MD 08/13/15 2056

## 2015-08-14 ENCOUNTER — Observation Stay: Payer: Medicaid Other

## 2015-08-14 LAB — CBC
HEMATOCRIT: 34.6 % — AB (ref 35.0–47.0)
HEMOGLOBIN: 11.3 g/dL — AB (ref 12.0–16.0)
MCH: 26.3 pg (ref 26.0–34.0)
MCHC: 32.6 g/dL (ref 32.0–36.0)
MCV: 80.7 fL (ref 80.0–100.0)
Platelets: 368 10*3/uL (ref 150–440)
RBC: 4.29 MIL/uL (ref 3.80–5.20)
RDW: 16 % — ABNORMAL HIGH (ref 11.5–14.5)
WBC: 10.1 10*3/uL (ref 3.6–11.0)

## 2015-08-14 LAB — TROPONIN I: Troponin I: <0.03 ng/mL (ref <0.031)

## 2015-08-14 LAB — CREATININE, SERUM: CREATININE: 0.74 mg/dL (ref 0.44–1.00)

## 2015-08-14 MED ORDER — IOHEXOL 350 MG/ML SOLN
100.0000 mL | Freq: Once | INTRAVENOUS | Status: AC | PRN
  Administered 2015-08-14: 100 mL via INTRAVENOUS

## 2015-08-14 MED ORDER — PRENATAL MULTIVITAMIN CH
1.0000 | ORAL_TABLET | Freq: Every day | ORAL | Status: DC
  Administered 2015-08-14 (×2): 1 via ORAL
  Filled 2015-08-14 (×3): qty 1

## 2015-08-14 MED ORDER — HYDRALAZINE HCL 20 MG/ML IJ SOLN: 10.0000 mg | INTRAMUSCULAR | Status: DC | PRN

## 2015-08-14 MED ORDER — METOPROLOL TARTRATE 100 MG PO TABS
100.0000 mg | ORAL_TABLET | Freq: Every day | ORAL | Status: DC
  Administered 2015-08-14 – 2015-08-15 (×2): 100 mg via ORAL
  Filled 2015-08-14 (×2): qty 1

## 2015-08-14 MED ORDER — METOPROLOL-HYDROCHLOROTHIAZIDE 100-25 MG PO TABS: 1.0000 | ORAL_TABLET | Freq: Every day | ORAL | Status: DC

## 2015-08-14 MED ORDER — HYDROCHLOROTHIAZIDE 25 MG PO TABS
25.0000 mg | ORAL_TABLET | Freq: Every day | ORAL | Status: DC
  Administered 2015-08-14 – 2015-08-15 (×2): 25 mg via ORAL
  Filled 2015-08-14 (×2): qty 1

## 2015-08-14 MED ORDER — DIPHENHYDRAMINE HCL 25 MG PO CAPS
25.0000 mg | ORAL_CAPSULE | Freq: Once | ORAL | Status: AC
  Administered 2015-08-14: 25 mg via ORAL
  Filled 2015-08-14: qty 1

## 2015-08-14 MED ORDER — BUTALBITAL-APAP-CAFFEINE 50-325-40 MG PO TABS
1.0000 | ORAL_TABLET | Freq: Four times a day (QID) | ORAL | Status: DC | PRN
  Administered 2015-08-14: 1 via ORAL
  Filled 2015-08-14 (×2): qty 1

## 2015-08-14 MED ORDER — MAGNESIUM SULFATE 2 GM/50ML IV SOLN
2.0000 g | Freq: Once | INTRAVENOUS | Status: AC
  Administered 2015-08-14: 2 g via INTRAVENOUS
  Filled 2015-08-14: qty 50

## 2015-08-14 NOTE — Progress Notes (Signed)
Pt informed NT that she had a headache.  Entered room and pt snoring lightly.  Woke pt up to give her medication for headache.  Will monitor.

## 2015-08-14 NOTE — Progress Notes (Signed)
Back from CT

## 2015-08-14 NOTE — Progress Notes (Signed)
Spoke with Dr. Anselm Jungling regarding pt, will be up to see her

## 2015-08-14 NOTE — Progress Notes (Signed)
To CT via bed.

## 2015-08-14 NOTE — Care Management (Addendum)
Patient admitted under observation chest pain.  Troponins are negative. Blood pressure was significantly elevated on admission.  Patient's PCP was adjusting blood pressure medications prior to this presentation.  Independent all adls and driving.  No issues accessing medical care or obtaining medications.  No cardiology consult order present at this time

## 2015-08-14 NOTE — Progress Notes (Signed)
Pt called me into room, states she had sudden pain mid epigastric area and short of breath.  O2 sat 100% on ra, HR 82.  Will continue to monitor.

## 2015-08-14 NOTE — Progress Notes (Signed)
Benadryl 25mg  po given along with starting Magnesium Sulfate 2gm IV.  Will continue to monitor

## 2015-08-14 NOTE — Progress Notes (Signed)
Pt states pain better now an 8/10, only hurts on 1 side of her face now.  Will continue to monitor.

## 2015-08-14 NOTE — Plan of Care (Signed)
Problem: Phase I Progression Outcomes Goal: Anginal pain relieved Outcome: Completed/Met Date Met:  08/14/15 No c/o chest pain noted on shift

## 2015-08-14 NOTE — Progress Notes (Signed)
[place] [facility] [place] NAME: Maria Dean    MR#:  [id number]  DATE OF BIRTH:  08-Jul-1981  SUBJECTIVE:  CHIEF COMPLAINT:   Chief Complaint  Patient presents with  . Chest Pain    have severe headache, vision bluriness is better today. REVIEW OF SYSTEMS:  CONSTITUTIONAL: No fever, fatigue or weakness.  EYES: have some blurred vision.  EARS, NOSE, AND THROAT: No tinnitus or ear pain.  RESPIRATORY: No cough, shortness of breath, wheezing or hemoptysis.  CARDIOVASCULAR: had chest pain- yesterday, orthopnea, edema.  GASTROINTESTINAL: No nausea, vomiting, diarrhea or abdominal pain.  GENITOURINARY: No dysuria, hematuria.  ENDOCRINE: No polyuria, nocturia,  HEMATOLOGY: No anemia, easy bruising or bleeding SKIN: No rash or lesion. MUSCULOSKELETAL: No joint pain or arthritis.   NEUROLOGIC: No tingling, numbness, weakness.  PSYCHIATRY: No anxiety or depression.   ROS  DRUG ALLERGIES:   Allergies  Allergen Reactions  . Orphenadrine Shortness Of Breath  . Ephedrine Nausea And Vomiting    VITALS:  Blood pressure 125/75, pulse 73, temperature 98 F (36.7 C), temperature source Oral, resp. rate 17, height 5\' 6"  (1.676 m), weight 146.149 kg (322 lb 3.2 oz), last menstrual period 05/26/2015, SpO2 98 %.  PHYSICAL EXAMINATION:  GENERAL:  34 y.o.-year-old obase patient lying in the bed with no acute distress.  EYES: Pupils equal, round, reactive to light and accommodation. No scleral icterus. Extraocular muscles intact.  HEENT: Head atraumatic, normocephalic. Oropharynx and nasopharynx clear.  NECK:  Supple, no jugular venous distention. No thyroid enlargement, no tenderness.  LUNGS: Normal breath sounds bilaterally, no wheezing, rales,rhonchi or crepitation. No use of accessory muscles of respiration.  CARDIOVASCULAR: S1, S2 normal. No murmurs, rubs, or gallops.  ABDOMEN: Soft, nontender, nondistended. Bowel sounds present. No  organomegaly or mass.  EXTREMITIES: No pedal edema, cyanosis, or clubbing.  NEUROLOGIC: Cranial nerves II through XII are intact. Muscle strength 5/5 in all extremities. Sensation intact. Gait not checked.  PSYCHIATRIC: The patient is alert and oriented x 3.  SKIN: No obvious rash, lesion, or ulcer.   Physical Exam LABORATORY PANEL:   CBC  Recent Labs Lab 08/14/15 0713  WBC 10.1  HGB 11.3*  HCT 34.6*  PLT 368   ------------------------------------------------------------------------------------------------------------------  Chemistries   Recent Labs Lab 08/13/15 1616 08/14/15 0124  NA 136  --   K 3.6  --   CL 105  --   CO2 24  --   GLUCOSE 115*  --   BUN 13  --   CREATININE 0.75 0.74  CALCIUM 9.0  --    ------------------------------------------------------------------------------------------------------------------  Cardiac Enzymes  Recent Labs Lab 08/14/15 0124 08/14/15 0713  TROPONINI <0.03 <0.03   ------------------------------------------------------------------------------------------------------------------  RADIOLOGY:  Dg Chest 2 View  08/13/2015  CLINICAL DATA:  Pain.  Dizziness.  Intermittent shortness of breath. EXAM: CHEST  2 VIEW COMPARISON:  None. FINDINGS: The heart size and mediastinal contours are within normal limits. Both lungs are clear. The visualized skeletal structures are unremarkable. IMPRESSION: No active cardiopulmonary disease. Electronically Signed   By: Kerby Moors M.D.   On: 08/13/2015 16:43   Ct Head Wo Contrast  08/14/2015  CLINICAL DATA:  Migraine headaches since yesterday. EXAM: CT HEAD WITHOUT CONTRAST TECHNIQUE: Contiguous axial images were obtained from the base of the skull through the vertex without intravenous contrast. COMPARISON:  04/23/2013 FINDINGS: Gray-white differentiation is maintained. No CT evidence of acute large territory infarct. No intraparenchymal or extra-axial hemorrhage. Normal size and configuration  of  the ventricles and basilar cisterns. No midline shift. There is a punctate (approximately 0.6 x 0.4 cm) punctate calcified structure about the inner table of the right frontal calvarium which is unchanged since the 03/1929/2014 examination. No associated adjacent mass effect. Otherwise, no intraparenchymal or extra-axial mass. Unchanged size a configuration of the ventricles and basilar cisterns. No midline shift. Limited visualization of the paranasal sinuses and mastoid air cells is normal. Mildly prominent lymph nodes are noted about the base of the occiput bilaterally with dominant right-sided subcutaneous lymph node measuring 0.9 cm in greatest short axis diameter (image 30, series 2). IMPRESSION: 1. No acute intracranial process. 2. 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 Electronically Signed   By: Sandi Mariscal M.D.   On: 08/14/2015 16:01   Ct Angio Chest Pe W/cm &/or Wo Cm  08/14/2015  CLINICAL DATA:  Acute onset of substernal chest pain and tightness. Blurred vision. Known hypertension and postpartum cardiomyopathy. Initial encounter. EXAM: CT ANGIOGRAPHY CHEST WITH CONTRAST TECHNIQUE: Multidetector CT imaging of the chest was performed using the standard protocol during bolus administration of intravenous contrast. Multiplanar CT image reconstructions and MIPs were obtained to evaluate the vascular anatomy. CONTRAST:  119mL OMNIPAQUE IOHEXOL 350 MG/ML SOLN COMPARISON:  Chest radiograph performed 08/13/2015, and CT of the abdomen and pelvis from 03/06/2015 FINDINGS: There is no evidence of pulmonary embolus. The lungs appear clear bilaterally. There is no evidence of significant focal consolidation, pleural effusion or pneumothorax. No masses are identified; no abnormal focal contrast enhancement is seen. The mediastinum is unremarkable in appearance. No  mediastinal lymphadenopathy is seen. No pericardial effusion is identified. The great vessels are grossly unremarkable. No axillary lymphadenopathy is seen. The visualized portions of the thyroid gland are unremarkable in appearance. The visualized portions of the liver and spleen are unremarkable. The previously noted stone within the gallbladder is partially characterized. No acute osseous abnormalities are seen. Review of the MIP images confirms the above findings. IMPRESSION: 1. No evidence of pulmonary embolus. 2. Lungs clear bilaterally. 3. Cholelithiasis again noted. Visualized portions of the gallbladder are otherwise unremarkable. Electronically Signed   By: Garald Balding M.D.   On: 08/14/2015 00:43    ASSESSMENT AND PLAN:   Active Problems:   Chest pain, central  * Hypertensive urgency: Continue home medications, add as needed hydralazine. She states this is been a recurrent issue which she is working on with her PCP Dr. Iona Beard   BP is better controlled now. * Chest pain, central: Telemetry, negative cardiac enzymes, aspirin   Likely reason was uncontrolled HTn * migraine headache    Have some blurry vision.     IV magnesium, Fioricet, benadryl, and get CT head to r/o any pathology. * Venous return embolism prophylactic: Heparin subcutaneous  All the records are reviewed and case discussed with Care Management/Social Workerr. Management plans discussed with the patient, family and they are in agreement.  CODE STATUS: full  TOTAL TIME TAKING CARE OF THIS PATIENT:35 minutes.     POSSIBLE D/C In 1-2 DAYS, DEPENDING ON CLINICAL CONDITION.   Vaughan Basta M.D on 08/14/2015   Between 7am to 6pm - Pager - [phone]  After 6pm go to [email or web address] - password [email or web address] [facility]  Office  [phone]  CC: Primary care physician; PROVIDER NOT IN SYSTEM  Note: This dictation was prepared with Dragon dictation along with smaller phrase technology.  Any  transcriptional errors that result from this process are unintentional.

## 2015-08-15 MED ORDER — AMLODIPINE BESYLATE 5 MG PO TABS: 5.0000 mg | ORAL_TABLET | Freq: Every day | ORAL | Status: DC

## 2015-08-15 MED ORDER — BUTALBITAL-APAP-CAFFEINE 50-325-40 MG PO TABS: 1.0000 | ORAL_TABLET | Freq: Four times a day (QID) | ORAL | Status: DC | PRN

## 2015-08-15 MED ORDER — AMLODIPINE BESYLATE 5 MG PO TABS
5.0000 mg | ORAL_TABLET | Freq: Every day | ORAL | Status: DC
  Administered 2015-08-15: 5 mg via ORAL
  Filled 2015-08-15: qty 1

## 2015-08-15 NOTE — Progress Notes (Signed)
Pt discharged to home via wc.  Instructions and rx given to pt.  Questions answered.  No distress.  

## 2015-08-15 NOTE — Discharge Summary (Signed)
[facility] [place] NAME: Maria Dean    MR#:  [id number]  DATE OF BIRTH:  [birth year]  DATE OF ADMISSION:  08/13/2015 ADMITTING PHYSICIAN: Lytle Butte, MD  DATE OF DISCHARGE: 08/15/2015  PRIMARY CARE PHYSICIAN: PROVIDER NOT IN SYSTEM    ADMISSION DIAGNOSIS:  Blurred vision [H53.8] Numbness and tingling in left arm [R20.0] Chest pain [R07.9] Chest pain, unspecified chest pain type [R07.9] Non-intractable vomiting with nausea, vomiting of unspecified type [R11.2]  DISCHARGE DIAGNOSIS:  Active Problems:   Chest pain, central   Migraine head ache    Uncontrolled Htn.  SECONDARY DIAGNOSIS:   Past Medical History  Diagnosis Date  . Hypertension     HOSPITAL COURSE:   * Hypertensive urgency: Continue home medications, add as needed hydralazine. She states this is been a recurrent issue which she is working on with her PCP Dr. Iona Beard   Added amlodipin  BP is better controlled now. * Chest pain, central: Telemetry, negative cardiac enzymes, aspirin  Likely reason was uncontrolled HTn.   Resolved now, * migraine headache  Have some blurry vision.  IV magnesium, Fioricet, benadryl, and get CT head to r/o any pathology.    CT neghative,     Headache and vision symptoms resolved. * Venous return embolism prophylactic: Heparin subcutaneous  DISCHARGE CONDITIONS:   Stable.  CONSULTS OBTAINED:  Treatment Team:  Lytle Butte, MD  DRUG ALLERGIES:   Allergies  Allergen Reactions  . Orphenadrine Shortness Of Breath  . Ephedrine Nausea And Vomiting    DISCHARGE MEDICATIONS:   Current Discharge Medication List    START taking these medications   Details  amLODipine (NORVASC) 5 MG tablet Take 1 tablet (5 mg total) by mouth daily. Qty: 30 tablet, Refills: 0    butalbital-acetaminophen-caffeine (FIORICET, ESGIC) 50-325-40 MG tablet Take 1 tablet by mouth every 6 (six) hours as needed for headache or  migraine. Qty: 14 tablet, Refills: 0      CONTINUE these medications which have NOT CHANGED   Details  metoprolol-hydrochlorothiazide (LOPRESSOR HCT) 100-25 MG tablet Take 1 tablet by mouth daily.    Prenatal Vit-Fe Fumarate-FA (PRENATAL MULTIVITAMIN) TABS tablet Take 1 tablet by mouth at bedtime.    fexofenadine (ALLEGRA) 180 MG tablet Take 1 tablet (180 mg total) by mouth daily. Qty: 20 tablet, Refills: 0    oxyCODONE-acetaminophen (ROXICET) 5-325 MG per tablet Take 1 tablet by mouth every 4 (four) hours as needed for severe pain. Qty: 20 tablet, Refills: 0      STOP taking these medications     amoxicillin (AMOXIL) 500 MG capsule      traMADol (ULTRAM) 50 MG tablet          DISCHARGE INSTRUCTIONS:    Follow with PMD in 1-2 weeks.  If you experience worsening of your admission symptoms, develop shortness of breath, life threatening emergency, suicidal or homicidal thoughts you must seek medical attention immediately by calling 911 or calling your MD immediately  if symptoms less severe.  You Must read complete instructions/literature along with all the possible adverse reactions/side effects for all the Medicines you take and that have been prescribed to you. Take any new Medicines after you have completely understood and accept all the possible adverse reactions/side effects.   Please note  You were cared for by a hospitalist during your hospital stay. If you have any questions about your discharge medications or the care you received while you were in the hospital  after you are discharged, you can call the unit and asked to speak with the hospitalist on call if the hospitalist that took care of you is not available. Once you are discharged, your primary care physician will handle any further medical issues. Please note that NO REFILLS for any discharge medications will be authorized once you are discharged, as it is imperative that you return to your primary care physician  (or establish a relationship with a primary care physician if you do not have one) for your aftercare needs so that they can reassess your need for medications and monitor your lab values.    Today   CHIEF COMPLAINT:   Chief Complaint  Patient presents with  . Chest Pain    HISTORY OF PRESENT ILLNESS:  Maria Dean  is a 34 y.o. female with a known history of essential hypertension, postpartum cardiomyopathy who is presenting with chest pain. She describes acute onset of chest pain retrosternal in location tightness in quality with associated blurred vision. She denies any shortness of breath or further symptomatology. Her chest pain is somewhat improved however vision remains somewhat of an issue, despite this he actually denies any headache. Upon arrival to the emergency department she was noted to be hypertensive 170s over 100s.   VITAL SIGNS:  Blood pressure 156/86, pulse 85, temperature 97.9 F (36.6 C), temperature source Oral, resp. rate 17, height 5\' 6"  (1.676 m), weight 146.149 kg (322 lb 3.2 oz), last menstrual period 05/26/2015, SpO2 99 %.  I/O:   Intake/Output Summary (Last 24 hours) at 08/15/15 1039 Last data filed at 08/15/15 1010  Gross per 24 hour  Intake 2248.33 ml  Output    300 ml  Net 1948.33 ml    PHYSICAL EXAMINATION:   GENERAL: 34 y.o.-year-old obase patient lying in the bed with no acute distress.  EYES: Pupils equal, round, reactive to light and accommodation. No scleral icterus. Extraocular muscles intact.  HEENT: Head atraumatic, normocephalic. Oropharynx and nasopharynx clear.  NECK: Supple, no jugular venous distention. No thyroid enlargement, no tenderness.  LUNGS: Normal breath sounds bilaterally, no wheezing, rales,rhonchi or crepitation. No use of accessory muscles of respiration.  CARDIOVASCULAR: S1, S2 normal. No murmurs, rubs, or gallops.  ABDOMEN: Soft, nontender, nondistended. Bowel sounds present. No organomegaly or mass.   EXTREMITIES: No pedal edema, cyanosis, or clubbing.  NEUROLOGIC: Cranial nerves II through XII are intact. Muscle strength 5/5 in all extremities. Sensation intact. Gait not checked.  PSYCHIATRIC: The patient is alert and oriented x 3.  SKIN: No obvious rash, lesion, or ulcer.   DATA REVIEW:   CBC  Recent Labs Lab 08/14/15 0713  WBC 10.1  HGB 11.3*  HCT 34.6*  PLT 368    Chemistries   Recent Labs Lab 08/13/15 1616 08/14/15 0124  NA 136  --   K 3.6  --   CL 105  --   CO2 24  --   GLUCOSE 115*  --   BUN 13  --   CREATININE 0.75 0.74  CALCIUM 9.0  --     Cardiac Enzymes  Recent Labs Lab 08/14/15 0713  TROPONINI <0.03    Microbiology Results  Results for orders placed or performed in visit on 06/01/12  Urine culture     Status: None   Collection Time: 06/01/12  3:37 PM  Result Value Ref Range Status   Micro Text Report   Final       SOURCE: CLEAN CATCH    COMMENT  MIXED BACTERIAL ORGANISMS   COMMENT                   RESULTS SUGGESTIVE OF CONTAMINATION   ANTIBIOTIC                                                        RADIOLOGY:  Dg Chest 2 View  08/13/2015  CLINICAL DATA:  Pain.  Dizziness.  Intermittent shortness of breath. EXAM: CHEST  2 VIEW COMPARISON:  None. FINDINGS: The heart size and mediastinal contours are within normal limits. Both lungs are clear. The visualized skeletal structures are unremarkable. IMPRESSION: No active cardiopulmonary disease. Electronically Signed   By: Kerby Moors M.D.   On: 08/13/2015 16:43   Ct Head Wo Contrast  08/14/2015  CLINICAL DATA:  Migraine headaches since yesterday. EXAM: CT HEAD WITHOUT CONTRAST TECHNIQUE: Contiguous axial images were obtained from the base of the skull through the vertex without intravenous contrast. COMPARISON:  04/23/2013 FINDINGS: Gray-white differentiation is maintained. No CT evidence of acute large territory infarct. No intraparenchymal or extra-axial  hemorrhage. Normal size and configuration of the ventricles and basilar cisterns. No midline shift. There is a punctate (approximately 0.6 x 0.4 cm) punctate calcified structure about the inner table of the right frontal calvarium which is unchanged since the 03/1929/2014 examination. No associated adjacent mass effect. Otherwise, no intraparenchymal or extra-axial mass. Unchanged size a configuration of the ventricles and basilar cisterns. No midline shift. Limited visualization of the paranasal sinuses and mastoid air cells is normal. Mildly prominent lymph nodes are noted about the base of the occiput bilaterally with dominant right-sided subcutaneous lymph node measuring 0.9 cm in greatest short axis diameter (image 30, series 2). IMPRESSION: 1. No acute intracranial process. 2. 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 Electronically Signed   By: Sandi Mariscal M.D.   On: 08/14/2015 16:01   Ct Angio Chest Pe W/cm &/or Wo Cm  08/14/2015  CLINICAL DATA:  Acute onset of substernal chest pain and tightness. Blurred vision. Known hypertension and postpartum cardiomyopathy. Initial encounter. EXAM: CT ANGIOGRAPHY CHEST WITH CONTRAST TECHNIQUE: Multidetector CT imaging of the chest was performed using the standard protocol during bolus administration of intravenous contrast. Multiplanar CT image reconstructions and MIPs were obtained to evaluate the vascular anatomy. CONTRAST:  140mL OMNIPAQUE IOHEXOL 350 MG/ML SOLN COMPARISON:  Chest radiograph performed 08/13/2015, and CT of the abdomen and pelvis from 03/06/2015 FINDINGS: There is no evidence of pulmonary embolus. The lungs appear clear bilaterally. There is no evidence of significant focal consolidation, pleural effusion or pneumothorax. No masses are identified; no abnormal focal contrast enhancement is seen. The mediastinum  is unremarkable in appearance. No mediastinal lymphadenopathy is seen. No pericardial effusion is identified. The great vessels are grossly unremarkable. No axillary lymphadenopathy is seen. The visualized portions of the thyroid gland are unremarkable in appearance. The visualized portions of the liver and spleen are unremarkable. The previously noted stone within the gallbladder is partially characterized. No acute osseous abnormalities are seen. Review of the MIP images confirms the above findings. IMPRESSION: 1. No evidence of pulmonary embolus. 2. Lungs clear bilaterally. 3. Cholelithiasis again noted. Visualized portions of the gallbladder are otherwise unremarkable. Electronically Signed   By:  Garald Balding M.D.   On: 08/14/2015 00:43    EKG:   Orders placed or performed during the hospital encounter of 08/13/15  . ED EKG within 10 minutes  . ED EKG within 10 minutes  . EKG 12-Lead  . EKG 12-Lead      Management plans discussed with the patient, family and they are in agreement.  CODE STATUS:     Code Status Orders        Start     Ordered   08/13/15 2148  Full code   Continuous     08/13/15 2148      TOTAL TIME TAKING CARE OF THIS PATIENT: 35 minutes.    Vaughan Basta M.D on 08/15/2015 at 10:39 AM  Between 7am to 6pm - Pager - [phone]  After 6pm go to [email or web address] - password [email or web address] [facility]  Office  [phone]  CC: Primary care physician; PROVIDER NOT IN SYSTEM   Note: This dictation was prepared with Dragon dictation along with smaller phrase technology. Any transcriptional errors that result from this process are unintentional.

## 2015-08-20 DIAGNOSIS — D320 Benign neoplasm of cerebral meninges: Secondary | ICD-10-CM | POA: Insufficient documentation

## 2016-03-05 ENCOUNTER — Other Ambulatory Visit: Payer: Self-pay

## 2016-03-05 ENCOUNTER — Observation Stay
Admission: EM | Admit: 2016-03-05 | Discharge: 2016-03-07 | Disposition: A | Discharge status: Home / Self Care | Payer: Medicaid Other | Attending: General Surgery | Admitting: General Surgery

## 2016-03-05 ENCOUNTER — Emergency Department: Payer: Medicaid Other

## 2016-03-05 ENCOUNTER — Encounter: Payer: Self-pay | Admitting: Emergency Medicine

## 2016-03-05 DIAGNOSIS — Z79899 Other long term (current) drug therapy: Secondary | ICD-10-CM | POA: Insufficient documentation

## 2016-03-05 DIAGNOSIS — I10 Essential (primary) hypertension: Secondary | ICD-10-CM | POA: Insufficient documentation

## 2016-03-05 DIAGNOSIS — K7689 Other specified diseases of liver: Secondary | ICD-10-CM | POA: Insufficient documentation

## 2016-03-05 DIAGNOSIS — K8020 Calculus of gallbladder without cholecystitis without obstruction: Secondary | ICD-10-CM

## 2016-03-05 DIAGNOSIS — Z6841 Body Mass Index (BMI) 40.0 and over, adult: Secondary | ICD-10-CM | POA: Insufficient documentation

## 2016-03-05 DIAGNOSIS — Z8249 Family history of ischemic heart disease and other diseases of the circulatory system: Secondary | ICD-10-CM | POA: Diagnosis not present

## 2016-03-05 DIAGNOSIS — K8010 Calculus of gallbladder with chronic cholecystitis without obstruction: Principal | ICD-10-CM | POA: Insufficient documentation

## 2016-03-05 DIAGNOSIS — Z888 Allergy status to other drugs, medicaments and biological substances status: Secondary | ICD-10-CM | POA: Insufficient documentation

## 2016-03-05 DIAGNOSIS — R079 Chest pain, unspecified: Secondary | ICD-10-CM | POA: Insufficient documentation

## 2016-03-05 DIAGNOSIS — R109 Unspecified abdominal pain: Secondary | ICD-10-CM | POA: Diagnosis present

## 2016-03-05 DIAGNOSIS — Z9889 Other specified postprocedural states: Secondary | ICD-10-CM | POA: Insufficient documentation

## 2016-03-05 HISTORY — DX: Chest pain, unspecified: R07.9

## 2016-03-05 LAB — BASIC METABOLIC PANEL
ANION GAP: 7 (ref 5–15)
BUN: 17 mg/dL (ref 6–20)
CALCIUM: 9.8 mg/dL (ref 8.9–10.3)
CO2: 26 mmol/L (ref 22–32)
Chloride: 103 mmol/L (ref 101–111)
Creatinine, Ser: 0.86 mg/dL (ref 0.44–1.00)
Glucose, Bld: 104 mg/dL — ABNORMAL HIGH (ref 65–99)
POTASSIUM: 3.3 mmol/L — AB (ref 3.5–5.1)
Sodium: 136 mmol/L (ref 135–145)

## 2016-03-05 LAB — HEPATIC FUNCTION PANEL
ALT: 23 U/L (ref 14–54)
AST: 17 U/L (ref 15–41)
Albumin: 3.9 g/dL (ref 3.5–5.0)
Alkaline Phosphatase: 80 U/L (ref 38–126)
TOTAL PROTEIN: 8.3 g/dL — AB (ref 6.5–8.1)
Total Bilirubin: 0.6 mg/dL (ref 0.3–1.2)

## 2016-03-05 LAB — CBC
HCT: 38.2 % (ref 35.0–47.0)
HEMOGLOBIN: 12.6 g/dL (ref 12.0–16.0)
MCH: 27.1 pg (ref 26.0–34.0)
MCHC: 33 g/dL (ref 32.0–36.0)
MCV: 82.1 fL (ref 80.0–100.0)
Platelets: 381 10*3/uL (ref 150–440)
RBC: 4.65 MIL/uL (ref 3.80–5.20)
RDW: 15.8 % — ABNORMAL HIGH (ref 11.5–14.5)
WBC: 16 10*3/uL — ABNORMAL HIGH (ref 3.6–11.0)

## 2016-03-05 LAB — LIPASE, BLOOD: Lipase: 21 U/L (ref 11–51)

## 2016-03-05 LAB — TROPONIN I

## 2016-03-05 NOTE — ED Notes (Signed)
Pt states she began to experience central chest pain with associated headache and "blurry vision" this pm. Pt states she felt shob and had "sweating". Pt states pain is worse with "Bending over, putting pressure on it and breathing."

## 2016-03-06 ENCOUNTER — Observation Stay: Payer: Medicaid Other | Admitting: Anesthesiology

## 2016-03-06 ENCOUNTER — Encounter: Payer: Self-pay | Admitting: *Deleted

## 2016-03-06 ENCOUNTER — Emergency Department: Payer: Medicaid Other

## 2016-03-06 ENCOUNTER — Encounter
Admission: EM | Disposition: A | Discharge status: Home / Self Care | Payer: Self-pay | Source: Home / Self Care | Attending: Emergency Medicine

## 2016-03-06 DIAGNOSIS — K8020 Calculus of gallbladder without cholecystitis without obstruction: Secondary | ICD-10-CM | POA: Diagnosis not present

## 2016-03-06 DIAGNOSIS — R1011 Right upper quadrant pain: Secondary | ICD-10-CM | POA: Diagnosis not present

## 2016-03-06 DIAGNOSIS — R109 Unspecified abdominal pain: Secondary | ICD-10-CM | POA: Diagnosis present

## 2016-03-06 HISTORY — PX: CHOLECYSTECTOMY: SHX55

## 2016-03-06 LAB — CBC
HCT: 34.2 % — ABNORMAL LOW (ref 35.0–47.0)
Hemoglobin: 11.4 g/dL — ABNORMAL LOW (ref 12.0–16.0)
MCH: 27.3 pg (ref 26.0–34.0)
MCHC: 33.3 g/dL (ref 32.0–36.0)
MCV: 82 fL (ref 80.0–100.0)
Platelets: 338 10*3/uL (ref 150–440)
RBC: 4.17 MIL/uL (ref 3.80–5.20)
RDW: 15.5 % — AB (ref 11.5–14.5)
WBC: 12 10*3/uL — AB (ref 3.6–11.0)

## 2016-03-06 LAB — COMPREHENSIVE METABOLIC PANEL
ALBUMIN: 3.5 g/dL (ref 3.5–5.0)
ALK PHOS: 68 U/L (ref 38–126)
ALT: 22 U/L (ref 14–54)
ANION GAP: 7 (ref 5–15)
AST: 16 U/L (ref 15–41)
BILIRUBIN TOTAL: 0.7 mg/dL (ref 0.3–1.2)
BUN: 14 mg/dL (ref 6–20)
CALCIUM: 8.7 mg/dL — AB (ref 8.9–10.3)
CO2: 23 mmol/L (ref 22–32)
Chloride: 106 mmol/L (ref 101–111)
Creatinine, Ser: 0.76 mg/dL (ref 0.44–1.00)
GFR calc Af Amer: >60 mL/min (ref >60)
GLUCOSE: 97 mg/dL (ref 65–99)
Potassium: 3.8 mmol/L (ref 3.5–5.1)
Sodium: 136 mmol/L (ref 135–145)
TOTAL PROTEIN: 7.5 g/dL (ref 6.5–8.1)

## 2016-03-06 LAB — PREGNANCY, URINE: PREG TEST UR: NEGATIVE

## 2016-03-06 SURGERY — LAPAROSCOPIC CHOLECYSTECTOMY WITH INTRAOPERATIVE CHOLANGIOGRAM
Anesthesia: General

## 2016-03-06 MED ORDER — FENTANYL CITRATE (PF) 100 MCG/2ML IJ SOLN
25.0000 ug | INTRAMUSCULAR | Status: DC | PRN
  Administered 2016-03-06 (×4): 25 ug via INTRAVENOUS

## 2016-03-06 MED ORDER — PROPOFOL 10 MG/ML IV BOLUS
INTRAVENOUS | Status: DC | PRN
  Administered 2016-03-06: 200 mg via INTRAVENOUS

## 2016-03-06 MED ORDER — CIPROFLOXACIN IN D5W 400 MG/200ML IV SOLN
400.0000 mg | Freq: Two times a day (BID) | INTRAVENOUS | Status: DC
  Administered 2016-03-06 – 2016-03-07 (×3): 400 mg via INTRAVENOUS
  Filled 2016-03-06 (×3): qty 200

## 2016-03-06 MED ORDER — SUGAMMADEX SODIUM 200 MG/2ML IV SOLN
INTRAVENOUS | Status: DC | PRN
  Administered 2016-03-06: 290 mg via INTRAVENOUS

## 2016-03-06 MED ORDER — ONDANSETRON HCL 4 MG/2ML IJ SOLN: 4.0000 mg | Freq: Once | INTRAMUSCULAR | Status: DC | PRN

## 2016-03-06 MED ORDER — PANTOPRAZOLE SODIUM 40 MG IV SOLR
40.0000 mg | Freq: Every day | INTRAVENOUS | Status: DC
  Administered 2016-03-06: 40 mg via INTRAVENOUS
  Filled 2016-03-06: qty 40

## 2016-03-06 MED ORDER — KETOROLAC TROMETHAMINE 30 MG/ML IJ SOLN
INTRAMUSCULAR | Status: DC | PRN
  Administered 2016-03-06: 30 mg via INTRAVENOUS

## 2016-03-06 MED ORDER — FAMOTIDINE 20 MG PO TABS
40.0000 mg | ORAL_TABLET | Freq: Once | ORAL | Status: AC
  Administered 2016-03-06: 40 mg via ORAL
  Filled 2016-03-06: qty 2

## 2016-03-06 MED ORDER — SUCCINYLCHOLINE CHLORIDE 20 MG/ML IJ SOLN
INTRAMUSCULAR | Status: DC | PRN
  Administered 2016-03-06: 100 mg via INTRAVENOUS

## 2016-03-06 MED ORDER — METOPROLOL TARTRATE 50 MG PO TABS
100.0000 mg | ORAL_TABLET | Freq: Every day | ORAL | Status: DC
  Administered 2016-03-07: 100 mg via ORAL
  Filled 2016-03-06: qty 2
  Filled 2016-03-06: qty 1

## 2016-03-06 MED ORDER — HYDRALAZINE HCL 20 MG/ML IJ SOLN: 10.0000 mg | INTRAMUSCULAR | Status: DC | PRN

## 2016-03-06 MED ORDER — ONDANSETRON HCL 4 MG/2ML IJ SOLN
INTRAMUSCULAR | Status: DC | PRN
  Administered 2016-03-06: 4 mg via INTRAVENOUS

## 2016-03-06 MED ORDER — FENTANYL CITRATE (PF) 100 MCG/2ML IJ SOLN
INTRAMUSCULAR | Status: AC
Start: 2016-03-06 — End: 2016-03-07
  Filled 2016-03-06: qty 2

## 2016-03-06 MED ORDER — KETOROLAC TROMETHAMINE 30 MG/ML IJ SOLN
30.0000 mg | Freq: Four times a day (QID) | INTRAMUSCULAR | Status: DC
  Administered 2016-03-06 – 2016-03-07 (×3): 30 mg via INTRAVENOUS
  Filled 2016-03-06 (×4): qty 1

## 2016-03-06 MED ORDER — DIPHENHYDRAMINE HCL 25 MG PO CAPS: 25.0000 mg | ORAL_CAPSULE | Freq: Four times a day (QID) | ORAL | Status: DC | PRN

## 2016-03-06 MED ORDER — HYDROMORPHONE HCL 1 MG/ML IJ SOLN
1.0000 mg | INTRAMUSCULAR | Status: DC | PRN
  Administered 2016-03-06: 1 mg via INTRAVENOUS
  Filled 2016-03-06: qty 1

## 2016-03-06 MED ORDER — AMLODIPINE BESYLATE 5 MG PO TABS
5.0000 mg | ORAL_TABLET | Freq: Every day | ORAL | Status: DC
  Administered 2016-03-07: 5 mg via ORAL
  Filled 2016-03-06: qty 1

## 2016-03-06 MED ORDER — FENTANYL CITRATE (PF) 100 MCG/2ML IJ SOLN
INTRAMUSCULAR | Status: DC | PRN
  Administered 2016-03-06: 100 ug via INTRAVENOUS
  Administered 2016-03-06 (×5): 50 ug via INTRAVENOUS

## 2016-03-06 MED ORDER — LACTATED RINGERS IV SOLN
INTRAVENOUS | Status: DC
  Administered 2016-03-06: 06:00:00 via INTRAVENOUS

## 2016-03-06 MED ORDER — DIPHENHYDRAMINE HCL 50 MG/ML IJ SOLN
25.0000 mg | Freq: Four times a day (QID) | INTRAMUSCULAR | Status: DC | PRN
  Administered 2016-03-06: 25 mg via INTRAVENOUS
  Filled 2016-03-06: qty 1

## 2016-03-06 MED ORDER — KETOROLAC TROMETHAMINE 30 MG/ML IJ SOLN
INTRAMUSCULAR | Status: AC
  Filled 2016-03-06: qty 1

## 2016-03-06 MED ORDER — SODIUM CHLORIDE 0.9 % IV BOLUS (SEPSIS)
1000.0000 mL | Freq: Once | INTRAVENOUS | Status: AC
  Administered 2016-03-06: 1000 mL via INTRAVENOUS

## 2016-03-06 MED ORDER — DEXAMETHASONE SODIUM PHOSPHATE 10 MG/ML IJ SOLN
INTRAMUSCULAR | Status: DC | PRN
  Administered 2016-03-06: 5 mg via INTRAVENOUS

## 2016-03-06 MED ORDER — PIPERACILLIN-TAZOBACTAM 3.375 G IVPB
3.3750 g | Freq: Three times a day (TID) | INTRAVENOUS | Status: DC
  Administered 2016-03-06: 3.375 g via INTRAVENOUS
  Filled 2016-03-06 (×2): qty 50

## 2016-03-06 MED ORDER — LACTATED RINGERS IV SOLN
INTRAVENOUS | Status: DC | PRN
  Administered 2016-03-06: 13:00:00 via INTRAVENOUS

## 2016-03-06 MED ORDER — GI COCKTAIL ~~LOC~~
30.0000 mL | ORAL | Status: AC
  Administered 2016-03-06: 30 mL via ORAL
  Filled 2016-03-06: qty 30

## 2016-03-06 MED ORDER — METRONIDAZOLE IN NACL 5-0.79 MG/ML-% IV SOLN
500.0000 mg | Freq: Three times a day (TID) | INTRAVENOUS | Status: DC
  Administered 2016-03-06 – 2016-03-07 (×3): 500 mg via INTRAVENOUS
  Filled 2016-03-06 (×4): qty 100

## 2016-03-06 MED ORDER — ONDANSETRON 8 MG PO TBDP
4.0000 mg | ORAL_TABLET | Freq: Four times a day (QID) | ORAL | Status: DC | PRN
  Administered 2016-03-06: 4 mg via ORAL
  Filled 2016-03-06: qty 1

## 2016-03-06 MED ORDER — MIDAZOLAM HCL 5 MG/5ML IJ SOLN
INTRAMUSCULAR | Status: DC | PRN
  Administered 2016-03-06: 2 mg via INTRAVENOUS

## 2016-03-06 MED ORDER — ACETAMINOPHEN 10 MG/ML IV SOLN
INTRAVENOUS | Status: DC | PRN
  Administered 2016-03-06: 1000 mg via INTRAVENOUS

## 2016-03-06 MED ORDER — ACETAMINOPHEN 10 MG/ML IV SOLN
INTRAVENOUS | Status: AC
  Filled 2016-03-06: qty 100

## 2016-03-06 MED ORDER — BUPIVACAINE-EPINEPHRINE 0.25% -1:200000 IJ SOLN
INTRAMUSCULAR | Status: DC | PRN
  Administered 2016-03-06: 30 mL

## 2016-03-06 MED ORDER — METOPROLOL-HYDROCHLOROTHIAZIDE 100-25 MG PO TABS: 1.0000 | ORAL_TABLET | Freq: Every day | ORAL | Status: DC

## 2016-03-06 MED ORDER — LISINOPRIL 10 MG PO TABS
10.0000 mg | ORAL_TABLET | Freq: Every day | ORAL | Status: DC
  Administered 2016-03-07: 10 mg via ORAL
  Filled 2016-03-06: qty 1

## 2016-03-06 MED ORDER — LIDOCAINE HCL (CARDIAC) 20 MG/ML IV SOLN
INTRAVENOUS | Status: DC | PRN
  Administered 2016-03-06: 60 mg via INTRAVENOUS

## 2016-03-06 MED ORDER — HYDROCODONE-ACETAMINOPHEN 7.5-325 MG PO TABS: 1.0000 | ORAL_TABLET | ORAL | Status: DC | PRN

## 2016-03-06 MED ORDER — ONDANSETRON HCL 4 MG/2ML IJ SOLN: 4.0000 mg | Freq: Four times a day (QID) | INTRAMUSCULAR | Status: DC | PRN

## 2016-03-06 MED ORDER — BUPIVACAINE-EPINEPHRINE (PF) 0.25% -1:200000 IJ SOLN
INTRAMUSCULAR | Status: AC
  Filled 2016-03-06: qty 30

## 2016-03-06 MED ORDER — METOPROLOL TARTRATE 5 MG/5ML IV SOLN
INTRAVENOUS | Status: DC | PRN
  Administered 2016-03-06 (×3): 1 mg via INTRAVENOUS

## 2016-03-06 MED ORDER — HYDROCHLOROTHIAZIDE 25 MG PO TABS
25.0000 mg | ORAL_TABLET | Freq: Every day | ORAL | Status: DC
  Administered 2016-03-07: 25 mg via ORAL
  Filled 2016-03-06: qty 1

## 2016-03-06 MED ORDER — ROCURONIUM BROMIDE 100 MG/10ML IV SOLN
INTRAVENOUS | Status: DC | PRN
  Administered 2016-03-06: 20 mg via INTRAVENOUS
  Administered 2016-03-06: 10 mg via INTRAVENOUS

## 2016-03-06 SURGICAL SUPPLY — 41 items
APPLIER CLIP 5 13 M/L LIGAMAX5 (MISCELLANEOUS) ×2
BLADE SURG 15 STRL LF DISP TIS (BLADE) ×1 IMPLANT
BLADE SURG 15 STRL SS (BLADE) ×1
CANISTER SUCT 1200ML W/VALVE (MISCELLANEOUS) ×2 IMPLANT
CHLORAPREP W/TINT 26ML (MISCELLANEOUS) ×2 IMPLANT
CHOLANGIOGRAM CATH TAUT (CATHETERS) IMPLANT
CLEANER CAUTERY TIP 5X5 PAD (MISCELLANEOUS) ×1 IMPLANT
CLIP APPLIE 5 13 M/L LIGAMAX5 (MISCELLANEOUS) ×1 IMPLANT
DECANTER SPIKE VIAL GLASS SM (MISCELLANEOUS) ×4 IMPLANT
DEVICE TROCAR PUNCTURE CLOSURE (ENDOMECHANICALS) IMPLANT
DRAPE C-ARM XRAY 36X54 (DRAPES) ×2 IMPLANT
DRESSING TELFA 4X3 1S ST N-ADH (GAUZE/BANDAGES/DRESSINGS) ×2 IMPLANT
ELECT REM PT RETURN 9FT ADLT (ELECTROSURGICAL) ×2
ELECTRODE REM PT RTRN 9FT ADLT (ELECTROSURGICAL) ×1 IMPLANT
ENDOPOUCH RETRIEVER 10 (MISCELLANEOUS) ×2 IMPLANT
GLOVE BIO SURGEON STRL SZ7 (GLOVE) ×2 IMPLANT
GOWN STRL REUS W/ TWL LRG LVL3 (GOWN DISPOSABLE) ×3 IMPLANT
GOWN STRL REUS W/TWL LRG LVL3 (GOWN DISPOSABLE) ×3
IRRIGATION STRYKERFLOW (MISCELLANEOUS) ×1 IMPLANT
IRRIGATOR STRYKERFLOW (MISCELLANEOUS) ×2
IV CATH ANGIO 12GX3 LT BLUE (NEEDLE) ×2 IMPLANT
IV SOD CHL 0.9% 1000ML (IV SOLUTION) ×2 IMPLANT
L-HOOK LAP DISP 36CM (ELECTROSURGICAL) ×2
LHOOK LAP DISP 36CM (ELECTROSURGICAL) ×1 IMPLANT
LIQUID BAND (GAUZE/BANDAGES/DRESSINGS) ×2 IMPLANT
NEEDLE HYPO 22GX1.5 SAFETY (NEEDLE) ×2 IMPLANT
PACK LAP CHOLECYSTECTOMY (MISCELLANEOUS) ×2 IMPLANT
PAD CLEANER CAUTERY TIP 5X5 (MISCELLANEOUS) ×1
PENCIL ELECTRO HAND CTR (MISCELLANEOUS) ×2 IMPLANT
SCISSORS METZENBAUM CVD 33 (INSTRUMENTS) ×2 IMPLANT
SLEEVE ENDOPATH XCEL 5M (ENDOMECHANICALS) ×4 IMPLANT
STOPCOCK 3 WAY  REPLAC (MISCELLANEOUS) IMPLANT
SUT ETHIBOND 0 MO6 C/R (SUTURE) IMPLANT
SUT MNCRL AB 4-0 PS2 18 (SUTURE) ×2 IMPLANT
SUT VIC AB 0 CT2 27 (SUTURE) IMPLANT
SUT VICRYL 0 AB UR-6 (SUTURE) ×4 IMPLANT
SYR 20CC LL (SYRINGE) ×2 IMPLANT
TROCAR XCEL BLUNT TIP 100MML (ENDOMECHANICALS) ×2 IMPLANT
TROCAR XCEL NON-BLD 5MMX100MML (ENDOMECHANICALS) ×2 IMPLANT
TUBING INSUFFLATOR HI FLOW (MISCELLANEOUS) ×2 IMPLANT
WATER STERILE IRR 1000ML POUR (IV SOLUTION) ×2 IMPLANT

## 2016-03-06 NOTE — ED Provider Notes (Signed)
[facility] Emergency Department Provider Note  ____________________________________________  Time seen: 11:50 PM  I have reviewed the triage vital signs and the nursing notes.   HISTORY  Chief Complaint Chest Pain and Headache    HPI Maria Dean is a 35 y.o. female who complains of sudden epigastric pain that occurred while she was at work. It hurts to take a deep breath or bend over. No pain with eating. No fevers or chills but she did feel short of breath and break out in a sweat. Pain radiates to her back as well. In triage she described this as chest pain, but when indicating the location of the pain she indicates the epigastric area. No exertional symptoms. Pain is moderate in intensity, constant.    Past Medical History  Diagnosis Date  . Hypertension   . Chest pain      Patient Active Problem List   Diagnosis Date Noted  . Chest pain, central 08/13/2015     Past Surgical History  Procedure Laterality Date  . Cesarean section    . Leep       Current Outpatient Rx  Name  Route  Sig  Dispense  Refill  . amLODipine (NORVASC) 5 MG tablet   Oral   Take 1 tablet (5 mg total) by mouth daily.   30 tablet   0   . butalbital-acetaminophen-caffeine (FIORICET, ESGIC) 50-325-40 MG tablet   Oral   Take 1 tablet by mouth every 6 (six) hours as needed for headache or migraine.   14 tablet   0   . lisinopril (PRINIVIL,ZESTRIL) 10 MG tablet   Oral   Take 10 mg by mouth daily.         . metoprolol-hydrochlorothiazide (LOPRESSOR HCT) 100-25 MG tablet   Oral   Take 1 tablet by mouth daily.            Allergies Orphenadrine and Ephedrine   Family History  Problem Relation Age of Onset  . Hypertension Other     Social History Social History  Substance Use Topics  . Smoking status: Never Smoker   . Smokeless tobacco: Never Used  . Alcohol Use: No    Review of Systems  Constitutional:   No fever or chills.  Eyes:    No vision changes.  ENT:   No sore throat. No rhinorrhea. Cardiovascular:   No chest pain. Respiratory:   No dyspnea or cough. Gastrointestinal:   Positive epigastric pain, no vomiting or diarrhea.  Genitourinary:   Negative for dysuria or difficulty urinating. Musculoskeletal:   Negative for focal pain or swelling Neurological:   Negative for headaches 10-point ROS otherwise negative.  ____________________________________________   PHYSICAL EXAM:  VITAL SIGNS: ED Triage Vitals  Enc Vitals Group     BP 03/05/16 2048 156/89 mmHg     Pulse Rate 03/05/16 2042 90     Resp 03/05/16 2042 22     Temp 03/05/16 2042 98.7 F (37.1 C)     Temp Source 03/05/16 2042 Oral     SpO2 03/05/16 2042 100 %     Weight 03/05/16 2042 310 lb (140.615 kg)     Height 03/05/16 2042 5\' 5"  (1.651 m)     Head Cir --      Peak Flow --      Pain Score 03/05/16 2047 7     Pain Loc --      Pain Edu? --      Excl. in [facility]? --  Vital signs reviewed, nursing assessments reviewed.   Constitutional:   Alert and oriented. Well appearing and in no distress. Eyes:   No scleral icterus. No conjunctival pallor. PERRL. EOMI.  No nystagmus. ENT   Head:   Normocephalic and atraumatic.   Nose:   No congestion/rhinnorhea. No septal hematoma   Mouth/Throat:   MMM, no pharyngeal erythema. No peritonsillar mass.    Neck:   No stridor. No SubQ emphysema. No meningismus. Hematological/Lymphatic/Immunilogical:   No cervical lymphadenopathy. Cardiovascular:   RRR. Symmetric bilateral radial and DP pulses.  No murmurs.  Respiratory:   Normal respiratory effort without tachypnea nor retractions. Breath sounds are clear and equal bilaterally. No wheezes/rales/rhonchi. Gastrointestinal:   Soft with significant right upper quadrant tenderness. Non distended. There is no CVA tenderness.  No rebound, rigidity, or guarding. Genitourinary:   deferred Musculoskeletal:   Nontender with normal range of motion in all  extremities. No joint effusions.  No lower extremity tenderness.  No edema. Neurologic:   Normal speech and language.  CN 2-10 normal. Motor grossly intact. No gross focal neurologic deficits are appreciated.  Skin:    Skin is warm, dry and intact. No rash noted.  No petechiae, purpura, or bullae.  ____________________________________________    LABS (pertinent positives/negatives) (all labs ordered are listed, but only abnormal results are displayed) Labs Reviewed  BASIC METABOLIC PANEL - Abnormal; Notable for the following:    Potassium 3.3 (*)    Glucose, Bld 104 (*)    All other components within normal limits  CBC - Abnormal; Notable for the following:    WBC 16.0 (*)    RDW 15.8 (*)    All other components within normal limits  HEPATIC FUNCTION PANEL - Abnormal; Notable for the following:    Total Protein 8.3 (*)    Bilirubin, Direct <0.1 (*)    All other components within normal limits  TROPONIN I  LIPASE, BLOOD   ____________________________________________   EKG  Interpreted by me  Date: 03/06/2016  Rate: 89  Rhythm: normal sinus rhythm  QRS Axis: normal  Intervals: normal  ST/T Wave abnormalities: normal  Conduction Disutrbances: none  Narrative Interpretation: unremarkable      ____________________________________________    RADIOLOGY  Ultrasound right upper quadrant shows 2.7 cm gallstone without evidence of acute cholecystitis  ____________________________________________   PROCEDURES   ____________________________________________   INITIAL IMPRESSION / ASSESSMENT AND PLAN / ED COURSE  Pertinent labs & imaging results that were available during my care of the patient were reviewed by me and considered in my medical decision making (see chart for details).  Patient presents with upper abdominal pain, found to have large gallstone. On reassessment at 3:45 AM, she has persistent upper abdominal pain. She is still very tender in the right  upper quadrant area, and actually has developed tachycardia with a heart rate of 110-120. I rechecked her temperature and it was 98.0. With the large gallstone that is symptomatic, tachycardia, and leukocytosis, I discussed the case with the surgeon on-call who will evaluate the patient.     ____________________________________________   FINAL CLINICAL IMPRESSION(S) / ED DIAGNOSES  Final diagnoses:  Calculus of gallbladder without cholecystitis without obstruction       Portions of this note were generated with dragon dictation software. Dictation errors may occur despite best attempts at proofreading.   Carrie Mew, MD 03/06/16 [phone]

## 2016-03-06 NOTE — Op Note (Signed)
Laparoscopic Cholecystectomy  Pre-operative Diagnosis: Acute cholecystitis  Post-operative Diagnosis: Same  Procedure: Laparoscopic lysis adhesions                      Laparoscopic cholecystectomy                      Laparoscopic liver biopsy of liver nodule  Surgeon: Caroleen Hamman, MD FACS  Anesthesia: Gen. with endotracheal tube   Findings: Acute Cholecystitis  Liver nodule to the right of the gallbladder body, within segment V of the liver  Estimated Blood Loss: 25cc         Drains: none         Specimens: Gallbladder           Complications: none   Procedure Details  The patient was seen again in the Holding Room. The benefits, complications, treatment options, and expected outcomes were discussed with the patient. The risks of bleeding, infection, recurrence of symptoms, failure to resolve symptoms, bile duct damage, bile duct leak, retained common bile duct stone, bowel injury, any of which could require further surgery and/or ERCP, stent, or papillotomy were reviewed with the patient. The likelihood of improving the patient's symptoms with return to their baseline status is good.  The patient and/or family concurred with the proposed plan, giving informed consent.  The patient was taken to Operating Room, identified as Maria Dean and the procedure verified as Laparoscopic Cholecystectomy.  A Time Out was held and the above information confirmed.  Prior to the induction of general anesthesia, antibiotic prophylaxis was administered. VTE prophylaxis was in place. General endotracheal anesthesia was then administered and tolerated well. After the induction, the abdomen was prepped with Chloraprep and draped in the sterile fashion. The patient was positioned in the supine position.  Local anesthetic  was injected into the skin near the umbilicus and an incision made. Cut down technique was used to enter the abdominal cavity and a Hasson trochar was placed after two vicryl  stitches were anchored to the fascia. Pneumoperitoneum was then created with CO2 and tolerated well without any adverse changes in the patient's vital signs.  Three 5-mm ports were placed in the right upper quadrant all under direct vision. All skin incisions  were infiltrated with a local anesthetic agent before making the incision and placing the trocars.     The patient was positioned  in reverse Trendelenburg, tilted slightly to the patient's left. Thick and extensive adhesions from the duodenum to the gallbladder and from the omentum to the gallbladder as well as from the omentum to the liver. These were lysed very careful with a combination of scissors and electrocautery. The gallbladder was identified, the fundus grasped and retracted cephalad. Adhesions were lysed bluntly. The infundibulum was grasped and retracted laterally, exposing the peritoneum overlying the triangle of Calot. This was then divided and exposed in a blunt fashion. An extended critical view of the cystic duct and cystic artery was obtained.  The cystic duct was clearly identified and bluntly dissected.   Artery and duct were double clipped and divided.  The gallbladder was taken from the gallbladder fossa in a retrograde fashion with the electrocautery. The gallbladder was removed and placed in an Endocatch bag. The liver bed was irrigated and inspected. Hemostasis was achieved with the electrocautery. Copious irrigation was utilized and was repeatedly aspirated until clear.  The gallbladder and Endocatch sac were then removed through the epigastric port site.  There  was a liver nodule to the right of the gallbladder fossa and we performed a incisional biopsy in the standard fashion using electrocautery. Inspection of the right upper quadrant was performed. No bleeding, bile duct injury or leak, or bowel injury was noted. Pneumoperitoneum was released.  The periumbilical port site was closed with figure-of-eight 0 Vicryl sutures.  4-0 subcuticular Monocryl was used to close the skin. Dermabond was  applied.  The patient was then extubated and brought to the recovery room in stable condition. Sponge, lap, and needle counts were correct at closure and at the conclusion of the case.               Caroleen Hamman, MD, FACS

## 2016-03-06 NOTE — Anesthesia Preprocedure Evaluation (Signed)
Anesthesia Evaluation  Patient identified by MRN, date of birth, ID band Patient awake    Reviewed: Allergy & Precautions, NPO status , Patient's Chart, lab work & pertinent test results  Airway Mallampati: III       Dental  (+) Teeth Intact   Pulmonary neg pulmonary ROS,     + decreased breath sounds      Cardiovascular Exercise Tolerance: Good hypertension, Pt. on medications  Rhythm:Regular Rate:Normal     Neuro/Psych negative neurological ROS     GI/Hepatic negative GI ROS, Neg liver ROS,   Endo/Other  Morbid obesity  Renal/GU negative Renal ROS     Musculoskeletal negative musculoskeletal ROS (+)   Abdominal (+) + obese,   Peds  Hematology negative hematology ROS (+)   Anesthesia Other Findings   Reproductive/Obstetrics                             Anesthesia Physical Anesthesia Plan  ASA: II  Anesthesia Plan: General   Post-op Pain Management:    Induction: Intravenous  Airway Management Planned: Oral ETT  Additional Equipment:   Intra-op Plan:   Post-operative Plan: Extubation in OR  Informed Consent: I have reviewed the patients History and Physical, chart, labs and discussed the procedure including the risks, benefits and alternatives for the proposed anesthesia with the patient or authorized representative who has indicated his/her understanding and acceptance.     Plan Discussed with: CRNA  Anesthesia Plan Comments:         Anesthesia Quick Evaluation

## 2016-03-06 NOTE — Anesthesia Postprocedure Evaluation (Signed)
Anesthesia Post Note  Patient: Maria Dean  Procedure(s) Performed: Procedure(s) (LRB): LAPAROSCOPIC CHOLECYSTECTOMY, LIVER BIOPSY (N/A)  Patient location during evaluation: PACU Anesthesia Type: General Level of consciousness: awake Vital Signs Assessment: post-procedure vital signs reviewed and stable Respiratory status: spontaneous breathing Cardiovascular status: blood pressure returned to baseline Anesthetic complications: no    Last Vitals:  Filed Vitals:   03/06/16 1446 03/06/16 1457  BP:  131/74  Pulse: 94 96  Temp:    Resp: 21 23    Last Pain:  Filed Vitals:   03/06/16 1458  PainSc: 6                  VAN STAVEREN,Tikita Mabee

## 2016-03-06 NOTE — Progress Notes (Signed)
Preoperative Review   Patient is met in the preoperative holding area. The history is reviewed in the chart and with the patient. I personally reviewed the options and rationale as well as the risks of this procedure that have been previously discussed with the patient. All questions asked by the patient and/or family were answered to their satisfaction.  Patient agrees to proceed with this procedure at this time.  Dionta Larke M.D. FACS   

## 2016-03-06 NOTE — Transfer of Care (Signed)
Immediate Anesthesia Transfer of Care Note  Patient: Maria Dean  Procedure(s) Performed: Procedure(s): LAPAROSCOPIC CHOLECYSTECTOMY, LIVER BIOPSY (N/A)  Patient Location: PACU  Anesthesia Type:General  Level of Consciousness: sedated  Airway & Oxygen Therapy: Patient Spontanous Breathing and Patient connected to face mask oxygen  Post-op Assessment: Report given to RN  Post vital signs: Reviewed and stable  Last Vitals:  Filed Vitals:   03/06/16 0530 03/06/16 0557  BP:  120/67  Pulse: 86 74  Temp:  36.9 C  Resp: 22 18    Last Pain:  Filed Vitals:   03/06/16 0601  PainSc: 6          Complications: No apparent anesthesia complications

## 2016-03-06 NOTE — Anesthesia Procedure Notes (Signed)
Procedure Name: Intubation Date/Time: 03/06/2016 12:48 PM Performed by: Dionne Bucy Pre-anesthesia Checklist: Patient identified, Patient being monitored, Timeout performed, Emergency Drugs available and Suction available Patient Re-evaluated:Patient Re-evaluated prior to inductionOxygen Delivery Method: Circle system utilized Preoxygenation: Pre-oxygenation with 100% oxygen Intubation Type: IV induction Ventilation: Mask ventilation without difficulty Laryngoscope Size: Mac and 4 Grade View: Grade I Tube type: Oral Tube size: 7.5 mm Number of attempts: 1 Airway Equipment and Method: Stylet Placement Confirmation: ETT inserted through vocal cords under direct vision,  positive ETCO2 and breath sounds checked- equal and bilateral Secured at: 21 cm Tube secured with: Tape Dental Injury: Teeth and Oropharynx as per pre-operative assessment

## 2016-03-06 NOTE — H&P (Signed)
Patient ID: Maria Dean, female   DOB: 1980/12/04, 35 y.o.   MRN: [id number]  CC: ABDOMINAL PAIN  HPI Maria Dean is a 35 y.o. female presents emergency department with complaints of sudden epigastric pain that occurred as she finished work last night. The pain radiated around to her right side and was worsened by deep breathing and bending over. She denies any correlation to eating. She thinks she had something similar this during her last pregnancy. The pain is described as sharp and stabbing. She denies any fevers, chills, nausea, vomiting, diarrhea, constipation, shortness of breath. She is otherwise in her usual state of health.  HPI  Past Medical History  Diagnosis Date  . Hypertension   . Chest pain     Past Surgical History  Procedure Laterality Date  . Cesarean section    . Leep      Family History  Problem Relation Age of Onset  . Hypertension Other     Social History Social History  Substance Use Topics  . Smoking status: Never Smoker   . Smokeless tobacco: Never Used  . Alcohol Use: No    Allergies  Allergen Reactions  . Orphenadrine Shortness Of Breath  . Ephedrine Nausea And Vomiting    Current Facility-Administered Medications  Medication Dose Route Frequency Provider Last Rate Last Dose  . sodium chloride 0.9 % bolus 1,000 mL  1,000 mL Intravenous Once Carrie Mew, MD       Current Outpatient Prescriptions  Medication Sig Dispense Refill  . amLODipine (NORVASC) 5 MG tablet Take 1 tablet (5 mg total) by mouth daily. 30 tablet 0  . butalbital-acetaminophen-caffeine (FIORICET, ESGIC) 50-325-40 MG tablet Take 1 tablet by mouth every 6 (six) hours as needed for headache or migraine. 14 tablet 0  . lisinopril (PRINIVIL,ZESTRIL) 10 MG tablet Take 10 mg by mouth daily.    . metoprolol-hydrochlorothiazide (LOPRESSOR HCT) 100-25 MG tablet Take 1 tablet by mouth daily.       Review of Systems A Multi-point review of systems was asked and was  negative except for the findings documented in the history of present illness  Physical Exam Blood pressure 149/87, pulse 98, temperature 98.7 F (37.1 C), temperature source Oral, resp. rate 19, height 5\' 5"  (1.651 m), weight 140.615 kg (310 lb), SpO2 96 %. CONSTITUTIONAL: No acute distress. EYES: Pupils are equal, round, and reactive to light, Sclera are non-icteric. EARS, NOSE, MOUTH AND THROAT: The oropharynx is clear. The oral mucosa is pink and moist. Hearing is intact to voice. LYMPH NODES:  Lymph nodes in the neck are normal. RESPIRATORY:  Lungs are clear. There is normal respiratory effort, with equal breath sounds bilaterally, and without pathologic use of accessory muscles. CARDIOVASCULAR: Heart is regular without murmurs, gallops, or rubs. GI: The abdomen is very large, soft, tender to palpation in the right upper quadrant, and nondistended. There is no guarding, rebound, peritoneal signs, Murphy sign. There are no palpable masses. There is no hepatosplenomegaly. There are normal bowel sounds in all quadrants. GU: Rectal deferred.   MUSCULOSKELETAL: Normal muscle strength and tone. No cyanosis or edema.   SKIN: Turgor is good and there are no pathologic skin lesions or ulcers. NEUROLOGIC: Motor and sensation is grossly normal. Cranial nerves are grossly intact. PSYCH:  Oriented to person, place and time. Affect is normal.  Data Reviewed Images and labs reviewed. Labs concerning for leukocytosis of 16,000, hypokalemia 3.3. Ultrasound shows a single large gallstone in the gallbladder neck but without any pericholecystic  fluid, wall thickening, ductal dilatation. I have personally reviewed the patient's imaging, laboratory findings and medical records.    Assessment    Abdominal pain    Plan    Patient with abdominal pain and an ultrasound that shows cholelithiasis. Her history is concerning for cholecystitis but there are no image findings to support that diagnosis. Discussed  with patient in detail that we would bring her to the hospital for IV hydration, IV antibiotics and serial exams. Should her pain not resolve with antibiotics she would likely then progressed to needing a laparoscopic cholecystectomy in the next 24-48 hours. Patient voiced understanding and all questions were answered. She's excepting admission at this time.     Time spent with the patient was 30 minutes, with more than 50% of the time spent in face-to-face education, counseling and care coordination.     Clayburn Pert, MD FACS General Surgeon 03/06/2016, 4:03 AM

## 2016-03-07 MED ORDER — HYDROCODONE-ACETAMINOPHEN 7.5-325 MG PO TABS: 1.0000 | ORAL_TABLET | ORAL | Status: DC | PRN

## 2016-03-07 NOTE — Discharge Summary (Signed)
  Patient ID: Maria Dean MRN: [id number] DOB/AGE: [birth year] 35 y.o.  Admit date: 03/05/2016 Discharge date: 03/07/2016   Discharge Diagnoses:  Active Problems:   Abdominal pain   Procedures:lap chole, enterolysis and liver biopsy.  Hospital Course: 15 morbidly obese female presented with abdominal pain, N/V c/w acute cholecystitis and confirmed by U/S. Underwent a lap chole, there was a liver nodule that was biopsied. At the time of DC she was ambulating, taking PO, AVSS, pain was controlled. Her abdomen was soft, , minimal incisional tenderness, no infection, no peritonitis.  Condition at the tim of DC is stable Intraop findings d/w pt in detail  Disposition: 01-Home or Self Care  Discharge Instructions    Call MD for:  difficulty breathing, headache or visual disturbances    Complete by:  As directed      Call MD for:  extreme fatigue    Complete by:  As directed      Call MD for:  hives    Complete by:  As directed      Call MD for:  persistant dizziness or light-headedness    Complete by:  As directed      Call MD for:  persistant nausea and vomiting    Complete by:  As directed      Call MD for:  redness, tenderness, or signs of infection (pain, swelling, redness, odor or green/yellow discharge around incision site)    Complete by:  As directed      Call MD for:  severe uncontrolled pain    Complete by:  As directed      Call MD for:  temperature >100.4    Complete by:  As directed      Diet - low sodium heart healthy    Complete by:  As directed      Discharge instructions    Complete by:  As directed   Shower tomorrow. Please provide work excuse, Pt may return to work on 03/15/16     Increase activity slowly    Complete by:  As directed      Lifting restrictions    Complete by:  As directed   20 lbs     No dressing needed    Complete by:  As directed             Medication List    TAKE these medications        amLODipine 5 MG tablet  Commonly  known as:  NORVASC  Take 1 tablet (5 mg total) by mouth daily.     butalbital-acetaminophen-caffeine 50-325-40 MG tablet  Commonly known as:  FIORICET, ESGIC  Take 1 tablet by mouth every 6 (six) hours as needed for headache or migraine.     HYDROcodone-acetaminophen 7.5-325 MG tablet  Commonly known as:  NORCO  Take 1-2 tablets by mouth every 4 (four) hours as needed for moderate pain.     lisinopril 10 MG tablet  Commonly known as:  PRINIVIL,ZESTRIL  Take 10 mg by mouth daily.     metoprolol-hydrochlorothiazide 100-25 MG tablet  Commonly known as:  LOPRESSOR HCT  Take 1 tablet by mouth daily.           Follow-up Information    Follow up with [facility] In 2 weeks.   Contact information:   [place]       Caroleen Hamman, MD FACS

## 2016-03-07 NOTE — Progress Notes (Signed)
03/07/2016 10:36 AM  BP 143/69 mmHg  Pulse 87  Temp(Src) 97.9 F (36.6 C) (Oral)  Resp 18  Ht 5\' 5"  (1.651 m)  Wt 145.877 kg (321 lb 9.6 oz)  BMI 53.52 kg/m2  SpO2 96% Patient discharged per MD orders. Discharge instructions reviewed with patient and patient verbalized understanding. IV removed per policy. Prescriptions discussed and given to patient. Discharged ambulatory.  Almedia Balls, RN

## 2016-03-08 ENCOUNTER — Encounter: Payer: Self-pay | Admitting: Surgery

## 2016-03-09 ENCOUNTER — Emergency Department
Admission: EM | Admit: 2016-03-09 | Discharge: 2016-03-09 | Disposition: A | Discharge status: Home / Self Care | Payer: Medicaid Other | Attending: Emergency Medicine | Admitting: Emergency Medicine

## 2016-03-09 ENCOUNTER — Emergency Department: Payer: Medicaid Other

## 2016-03-09 ENCOUNTER — Encounter: Payer: Self-pay | Admitting: Emergency Medicine

## 2016-03-09 DIAGNOSIS — Z8719 Personal history of other diseases of the digestive system: Secondary | ICD-10-CM | POA: Diagnosis not present

## 2016-03-09 DIAGNOSIS — R42 Dizziness and giddiness: Secondary | ICD-10-CM | POA: Diagnosis present

## 2016-03-09 DIAGNOSIS — R0789 Other chest pain: Secondary | ICD-10-CM | POA: Diagnosis not present

## 2016-03-09 DIAGNOSIS — L03311 Cellulitis of abdominal wall: Secondary | ICD-10-CM

## 2016-03-09 DIAGNOSIS — I10 Essential (primary) hypertension: Secondary | ICD-10-CM | POA: Diagnosis not present

## 2016-03-09 DIAGNOSIS — Z79899 Other long term (current) drug therapy: Secondary | ICD-10-CM | POA: Diagnosis not present

## 2016-03-09 LAB — CBC WITH DIFFERENTIAL/PLATELET
Basophils Absolute: 0.1 10*3/uL (ref 0–0.1)
EOS ABS: 0.3 10*3/uL (ref 0–0.7)
HEMATOCRIT: 36.8 % (ref 35.0–47.0)
Hemoglobin: 12.3 g/dL (ref 12.0–16.0)
Lymphocytes Relative: 18 %
Lymphs Abs: 3.1 10*3/uL (ref 1.0–3.6)
MCH: 27.2 pg (ref 26.0–34.0)
MCHC: 33.5 g/dL (ref 32.0–36.0)
MCV: 81.2 fL (ref 80.0–100.0)
MONO ABS: 1.1 10*3/uL — AB (ref 0.2–0.9)
NEUTROS ABS: 12 10*3/uL — AB (ref 1.4–6.5)
Neutrophils Relative %: 72 %
PLATELETS: 361 10*3/uL (ref 150–440)
RBC: 4.53 MIL/uL (ref 3.80–5.20)
RDW: 15.4 % — AB (ref 11.5–14.5)
WBC: 16.6 10*3/uL — ABNORMAL HIGH (ref 3.6–11.0)

## 2016-03-09 LAB — COMPREHENSIVE METABOLIC PANEL
ALBUMIN: 3.4 g/dL — AB (ref 3.5–5.0)
ALK PHOS: 170 U/L — AB (ref 38–126)
ALT: 299 U/L — AB (ref 14–54)
ANION GAP: 6 (ref 5–15)
AST: 215 U/L — AB (ref 15–41)
BILIRUBIN TOTAL: 1.2 mg/dL (ref 0.3–1.2)
BUN: 13 mg/dL (ref 6–20)
CALCIUM: 8.7 mg/dL — AB (ref 8.9–10.3)
CO2: 27 mmol/L (ref 22–32)
CREATININE: 0.79 mg/dL (ref 0.44–1.00)
Chloride: 101 mmol/L (ref 101–111)
GFR calc Af Amer: >60 mL/min (ref >60)
GFR calc non Af Amer: >60 mL/min (ref >60)
GLUCOSE: 94 mg/dL (ref 65–99)
Potassium: 3.5 mmol/L (ref 3.5–5.1)
SODIUM: 134 mmol/L — AB (ref 135–145)
TOTAL PROTEIN: 7.4 g/dL (ref 6.5–8.1)

## 2016-03-09 LAB — URINALYSIS COMPLETE WITH MICROSCOPIC (ARMC ONLY)
BACTERIA UA: NONE SEEN
BILIRUBIN URINE: NEGATIVE
GLUCOSE, UA: NEGATIVE mg/dL
Ketones, ur: NEGATIVE mg/dL
Leukocytes, UA: NEGATIVE
NITRITE: NEGATIVE
Protein, ur: NEGATIVE mg/dL
Specific Gravity, Urine: 1.01 (ref 1.005–1.030)
WBC, UA: NONE SEEN WBC/hpf (ref 0–5)
pH: 5 (ref 5.0–8.0)

## 2016-03-09 LAB — SURGICAL PATHOLOGY

## 2016-03-09 LAB — LIPASE, BLOOD: Lipase: 22 U/L (ref 11–51)

## 2016-03-09 MED ORDER — ALUMINUM-MAGNESIUM-SIMETHICONE 200-200-20 MG/5ML PO SUSP: 30.0000 mL | Freq: Three times a day (TID) | ORAL | Status: DC

## 2016-03-09 MED ORDER — SODIUM CHLORIDE 0.9 % IV BOLUS (SEPSIS)
1000.0000 mL | Freq: Once | INTRAVENOUS | Status: AC
  Administered 2016-03-09: 1000 mL via INTRAVENOUS

## 2016-03-09 MED ORDER — IOPAMIDOL (ISOVUE-370) INJECTION 76%
100.0000 mL | Freq: Once | INTRAVENOUS | Status: AC | PRN
  Administered 2016-03-09: 100 mL via INTRAVENOUS

## 2016-03-09 MED ORDER — FAMOTIDINE 20 MG PO TABS: 20.0000 mg | ORAL_TABLET | Freq: Two times a day (BID) | ORAL | Status: DC

## 2016-03-09 MED ORDER — PANTOPRAZOLE SODIUM 40 MG IV SOLR
40.0000 mg | Freq: Once | INTRAVENOUS | Status: AC
  Administered 2016-03-09: 40 mg via INTRAVENOUS
  Filled 2016-03-09: qty 40

## 2016-03-09 MED ORDER — CEPHALEXIN 500 MG PO CAPS: 500.0000 mg | ORAL_CAPSULE | Freq: Three times a day (TID) | ORAL | Status: DC

## 2016-03-09 MED ORDER — NAPROXEN 500 MG PO TABS: 500.0000 mg | ORAL_TABLET | Freq: Two times a day (BID) | ORAL | Status: DC

## 2016-03-09 MED ORDER — GI COCKTAIL ~~LOC~~
30.0000 mL | ORAL | Status: AC
  Administered 2016-03-09: 30 mL via ORAL
  Filled 2016-03-09: qty 30

## 2016-03-09 NOTE — ED Notes (Signed)
Patient transported to CT 

## 2016-03-09 NOTE — ED Notes (Signed)
Pt to ed with c/o dizziness and diaphoresis after taking "pain pill" this am.  States she had gallbladder surgery on sat and is taking hydrocodone for the pain. Pt reports she had a similar episode here on Saturday after taking the med.

## 2016-03-09 NOTE — ED Provider Notes (Signed)
[facility] Emergency Department Provider Note  ____________________________________________  Time seen: 8:10 AM  I have reviewed the triage vital signs and the nursing notes.   HISTORY  Chief Complaint Dizziness    HPI Maria Dean is a 35 y.o. female who complains of dizziness and chest pain that started this morning at about 7 AM. The patient recently came to the emergency department with dizziness and upper abdominal pain 5 days ago. She was found to have a large gallstone that was symptomatic, with elevated white blood cell count and tachycardia and proceeded to have a cholecystectomy the next day, 4 days ago. Since then, has felt like she is overall improving, but the day after surgery she had an episode of dizziness and chest pain that resolved on its own, and then this morning she began having dizziness chest pain again. The chest pain is pleuritic and associated with shortness of breath. This in the left upper chest. Hurts when she moves around. Denies coughing. Also feels dizzy described as lightheadedness with the chest pain. Moderate intensity, not exertional. No fevers or vomiting.  Patient further reports that since the surgery she has not been able to having only and water intermittently over the past few days   Past Medical History  Diagnosis Date  . Hypertension   . Chest pain      Patient Active Problem List   Diagnosis Date Noted  . Abdominal pain 03/06/2016  . Calculus of gallbladder without cholecystitis without obstruction   . Chest pain, central 08/13/2015     Past Surgical History  Procedure Laterality Date  . Cesarean section    . Leep    . Cholecystectomy N/A 03/06/2016    Procedure: LAPAROSCOPIC CHOLECYSTECTOMY, LIVER BIOPSY;  Surgeon: Jules Husbands, MD;  Location: [facility] ORS;  Service: General;  Laterality: N/A;     Current Outpatient Rx  Name  Route  Sig  Dispense  Refill  . aluminum-magnesium hydroxide-simethicone  (MAALOX) [id number] MG/5ML SUSP   Oral   Take 30 mLs by mouth 4 (four) times daily -  before meals and at bedtime.   355 mL   0   . amLODipine (NORVASC) 5 MG tablet   Oral   Take 1 tablet (5 mg total) by mouth daily.   30 tablet   0   . butalbital-acetaminophen-caffeine (FIORICET, ESGIC) 50-325-40 MG tablet   Oral   Take 1 tablet by mouth every 6 (six) hours as needed for headache or migraine.   14 tablet   0   . cephALEXin (KEFLEX) 500 MG capsule   Oral   Take 1 capsule (500 mg total) by mouth 3 (three) times daily.   21 capsule   0   . famotidine (PEPCID) 20 MG tablet   Oral   Take 1 tablet (20 mg total) by mouth 2 (two) times daily.   60 tablet   0   . HYDROcodone-acetaminophen (NORCO) 7.5-325 MG tablet   Oral   Take 1-2 tablets by mouth every 4 (four) hours as needed for moderate pain.   30 tablet   0   . lisinopril (PRINIVIL,ZESTRIL) 10 MG tablet   Oral   Take 10 mg by mouth daily.         . metoprolol-hydrochlorothiazide (LOPRESSOR HCT) 100-25 MG tablet   Oral   Take 1 tablet by mouth daily.         . naproxen (NAPROSYN) 500 MG tablet   Oral   Take 1 tablet (  500 mg total) by mouth 2 (two) times daily with a meal.   20 tablet   0      Allergies Orphenadrine and Ephedrine   Family History  Problem Relation Age of Onset  . Hypertension Other     Social History Social History  Substance Use Topics  . Smoking status: Never Smoker   . Smokeless tobacco: Never Used  . Alcohol Use: No    Review of Systems  Constitutional:   No fever or chills.  Eyes:   No vision changes.  ENT:   No sore throat. No rhinorrhea. Cardiovascular:   Positive as above chest pain. Respiratory:   Positive shortness of breath without cough. Gastrointestinal:   Negative for abdominal pain, vomiting and diarrhea.  Genitourinary:   Negative for dysuria or difficulty urinating. Musculoskeletal:   Negative for focal pain or swelling Neurological:   Negative for  headaches 10-point ROS otherwise negative.  ____________________________________________   PHYSICAL EXAM:  VITAL SIGNS: ED Triage Vitals  Enc Vitals Group     BP 03/09/16 0727 123/91 mmHg     Pulse Rate 03/09/16 0727 94     Resp 03/09/16 0727 20     Temp 03/09/16 0727 97.8 F (36.6 C)     Temp Source 03/09/16 0727 Oral     SpO2 03/09/16 0727 98 %     Weight 03/09/16 0727 331 lb (150.141 kg)     Height 03/09/16 0727 5\' 6"  (1.676 m)     Head Cir --      Peak Flow --      Pain Score 03/09/16 0727 6     Pain Loc --      Pain Edu? --      Excl. in [place]? --     Vital signs reviewed, nursing assessments reviewed.   Constitutional:   Alert and oriented. Uncomfortable appearing. Eyes:   No scleral icterus. No conjunctival pallor. PERRL. EOMI.  No nystagmus. ENT   Head:   Normocephalic and atraumatic.   Nose:   No congestion/rhinnorhea. No septal hematoma   Mouth/Throat:   MMM, no pharyngeal erythema. No peritonsillar mass.    Neck:   No stridor. No SubQ emphysema. No meningismus. Hematological/Lymphatic/Immunilogical:   No cervical lymphadenopathy. Cardiovascular:   RRR. Symmetric bilateral radial and DP pulses.  No murmurs.  Respiratory:   Normal respiratory effort without tachypnea nor retractions. Breath sounds are clear and equal bilaterally. No wheezes/rales/rhonchi. Gastrointestinal:   Soft with tenderness and 4 or 5 cm area of erythema around the soft tissue of a supraumbilical laparoscopic incision. No fluctuance or induration or palpable subcutaneous mass or swelling. There is also tenderness in the left upper quadrant of the abdomen.. Other incision sites are noninflamed and well-healed.. Non distended. There is no CVA tenderness.  No rebound, rigidity, or guarding. Genitourinary:   deferred Musculoskeletal:   Nontender with normal range of motion in all extremities. No joint effusions.  No lower extremity tenderness.  No edema. Left upper chest wall in the area  of indicated pain is very tender to the touch which reproduces the pain. Neurologic:   Normal speech and language.  CN 2-10 normal. Motor grossly intact. No gross focal neurologic deficits are appreciated.  Skin:    Skin is warm, dry and intact. Erythema around surgical incision as above.  No petechiae, purpura, or bullae.  ____________________________________________    LABS (pertinent positives/negatives) (all labs ordered are listed, but only abnormal results are displayed) Labs Reviewed  COMPREHENSIVE METABOLIC PANEL -  Abnormal; Notable for the following:    Sodium 134 (*)    Calcium 8.7 (*)    Albumin 3.4 (*)    AST 215 (*)    ALT 299 (*)    Alkaline Phosphatase 170 (*)    All other components within normal limits  CBC WITH DIFFERENTIAL/PLATELET - Abnormal; Notable for the following:    WBC 16.6 (*)    RDW 15.4 (*)    Neutro Abs 12.0 (*)    Monocytes Absolute 1.1 (*)    All other components within normal limits  LIPASE, BLOOD  URINALYSIS COMPLETEWITH MICROSCOPIC ([facility] ONLY)  POC URINE PREG, ED   ____________________________________________   EKG  Interpreted by me Normal sinus rhythm rate of 82, normal axis and intervals. Normal QRS ST segments. Isolated T-wave inversion in lead 3. No evidence of right heart strain on EKG.  ____________________________________________    RADIOLOGY  CT angiogram chest unremarkable  ____________________________________________   PROCEDURES   ____________________________________________   INITIAL IMPRESSION / ASSESSMENT AND PLAN / ED COURSE  Pertinent labs & imaging results that were available during my care of the patient were reviewed by me and considered in my medical decision making (see chart for details).  Patient postop day 3 from cholecystectomy presents with chest pain.  On exam this is highly consistent with musculoskeletal pain, however with her recent surgery, morbid obesity, we will obtain a CT angiogram of  the chest to evaluate for pulmonary embolism. Low suspicion for ACS dissection mediastinitis pneumonia pneumothorax or sepsis. Exam is also suggestive of an element of gastritis, as well as cellulitis around her laparoscopic incision in the supraumbilical midline abdomen.  Plan: CT angiogram chest Labs, urine IV analgesics and antacids Initiate antibiotic therapy for abdominal wall cellulitis   ----------------------------------------- 11:19 AM on 03/09/2016 -----------------------------------------  Vital signs remained stable and normal. Workup reveals leukocytosis as well as elevation of the transaminases. This is all consistent with a recent cholecystectomy and postoperative state. Leukocytosis may also be in part due to developing abdominal wall cellulitis. I symptoms there is probably a degree of acid reflux as well contribute into these issues today. Start the patient on antacids, NSAIDs, Keflex, and follow up with primary care.    ____________________________________________   FINAL CLINICAL IMPRESSION(S) / ED DIAGNOSES  Final diagnoses:  Abdominal wall cellulitis  Dizziness  Chest wall pain       Portions of this note were generated with dragon dictation software. Dictation errors may occur despite best attempts at proofreading.   Carrie Mew, MD 03/09/16 [phone]

## 2016-03-09 NOTE — ED Notes (Signed)
Pt reports she had gallbladder removed Saturday and was discharge Sunday and today after taking pain med she began to feel sweaty and tightness in her chest.

## 2016-03-09 NOTE — Discharge Instructions (Signed)
Cellulitis Cellulitis is an infection of the skin and the tissue beneath it. The infected area is usually red and tender. Cellulitis occurs most often in the arms and lower legs.  CAUSES  Cellulitis is caused by bacteria that enter the skin through cracks or cuts in the skin. The most common types of bacteria that cause cellulitis are staphylococci and streptococci. SIGNS AND SYMPTOMS   Redness and warmth.  Swelling.  Tenderness or pain.  Fever. DIAGNOSIS  Your health care provider can usually determine what is wrong based on a physical exam. Blood tests may also be done. TREATMENT  Treatment usually involves taking an antibiotic medicine. HOME CARE INSTRUCTIONS   Take your antibiotic medicine as directed by your health care provider. Finish the antibiotic even if you start to feel better.  Keep the infected arm or leg elevated to reduce swelling.  Apply a warm cloth to the affected area up to 4 times per day to relieve pain.  Take medicines only as directed by your health care provider.  Keep all follow-up visits as directed by your health care provider. SEEK MEDICAL CARE IF:   You notice red streaks coming from the infected area.  Your red area gets larger or turns dark in color.  Your bone or joint underneath the infected area becomes painful after the skin has healed.  Your infection returns in the same area or another area.  You notice a swollen bump in the infected area.  You develop new symptoms.  You have a fever. SEEK IMMEDIATE MEDICAL CARE IF:   You feel very sleepy.  You develop vomiting or diarrhea.  You have a general ill feeling (malaise) with muscle aches and pains.   This information is not intended to replace advice given to you by your health care provider. Make sure you discuss any questions you have with your health care provider.   Document Released: 07/21/2005 Document Revised: 07/02/2015 Document Reviewed: 12/27/2011 Elsevier Interactive  Patient Education 2016 Elsevier Inc.  Chest Wall Pain Chest wall pain is pain in or around the bones and muscles of your chest. Sometimes, an injury causes this pain. Sometimes, the cause may not be known. This pain may take several weeks or longer to get better. HOME CARE INSTRUCTIONS  Pay attention to any changes in your symptoms. Take these actions to help with your pain:   Rest as told by your health care provider.   Avoid activities that cause pain. These include any activities that use your chest muscles or your abdominal and side muscles to lift heavy items.   If directed, apply ice to the painful area:  Put ice in a plastic bag.  Place a towel between your skin and the bag.  Leave the ice on for 20 minutes, 2-3 times per day.  Take over-the-counter and prescription medicines only as told by your health care provider.  Do not use tobacco products, including cigarettes, chewing tobacco, and e-cigarettes. If you need help quitting, ask your health care provider.  Keep all follow-up visits as told by your health care provider. This is important. SEEK MEDICAL CARE IF:  You have a fever.  Your chest pain becomes worse.  You have new symptoms. SEEK IMMEDIATE MEDICAL CARE IF:  You have nausea or vomiting.  You feel sweaty or light-headed.  You have a cough with phlegm (sputum) or you cough up blood.  You develop shortness of breath.   This information is not intended to replace advice given to you  by your health care provider. Make sure you discuss any questions you have with your health care provider.   Document Released: 10/11/2005 Document Revised: 07/02/2015 Document Reviewed: 01/06/2015 Elsevier Interactive Patient Education 2016 Elsevier Inc.  Dizziness Dizziness is a common problem. It is a feeling of unsteadiness or light-headedness. You may feel like you are about to faint. Dizziness can lead to injury if you stumble or fall. Anyone can become dizzy, but  dizziness is more common in older adults. This condition can be caused by a number of things, including medicines, dehydration, or illness. HOME CARE INSTRUCTIONS Taking these steps may help with your condition: Eating and Drinking  Drink enough fluid to keep your urine clear or pale yellow. This helps to keep you from becoming dehydrated. Try to drink more clear fluids, such as water.  Do not drink alcohol.  Limit your caffeine intake if directed by your health care provider.  Limit your salt intake if directed by your health care provider. Activity  Avoid making quick movements.  Rise slowly from chairs and steady yourself until you feel okay.  In the morning, first sit up on the side of the bed. When you feel okay, stand slowly while you hold onto something until you know that your balance is fine.  Move your legs often if you need to stand in one place for a long time. Tighten and relax your muscles in your legs while you are standing.  Do not drive or operate heavy machinery if you feel dizzy.  Avoid bending down if you feel dizzy. Place items in your home so that they are easy for you to reach without leaning over. Lifestyle  Do not use any tobacco products, including cigarettes, chewing tobacco, or electronic cigarettes. If you need help quitting, ask your health care provider.  Try to reduce your stress level, such as with yoga or meditation. Talk with your health care provider if you need help. General Instructions  Watch your dizziness for any changes.  Take medicines only as directed by your health care provider. Talk with your health care provider if you think that your dizziness is caused by a medicine that you are taking.  Tell a friend or a family member that you are feeling dizzy. If he or she notices any changes in your behavior, have this person call your health care provider.  Keep all follow-up visits as directed by your health care provider. This is  important. SEEK MEDICAL CARE IF:  Your dizziness does not go away.  Your dizziness or light-headedness gets worse.  You feel nauseous.  You have reduced hearing.  You have new symptoms.  You are unsteady on your feet or you feel like the room is spinning. SEEK IMMEDIATE MEDICAL CARE IF:  You vomit or have diarrhea and are unable to eat or drink anything.  You have problems talking, walking, swallowing, or using your arms, hands, or legs.  You feel generally weak.  You are not thinking clearly or you have trouble forming sentences. It may take a friend or family member to notice this.  You have chest pain, abdominal pain, shortness of breath, or sweating.  Your vision changes.  You notice any bleeding.  You have a headache.  You have neck pain or a stiff neck.  You have a fever.   This information is not intended to replace advice given to you by your health care provider. Make sure you discuss any questions you have with your health care  provider.   Document Released: 04/06/2001 Document Revised: 02/25/2015 Document Reviewed: 10/07/2014 Elsevier Interactive Patient Education [employer].

## 2016-03-11 ENCOUNTER — Telehealth: Payer: Self-pay

## 2016-03-11 DIAGNOSIS — R1011 Right upper quadrant pain: Secondary | ICD-10-CM

## 2016-03-11 NOTE — Telephone Encounter (Signed)
Patient was seen in Emergency Room on 03/09/16  For abdominal pain s/p Cholecystectomy and Liver biopsy on 03/06/16 with Dr. Dahlia Byes. In ER, LFT's and CBC were elevated. Spoke with Dr. Dahlia Byes at this time. We have reordered CBC and CMP for patient to have done today as well as a RUQ ultrasound. Ultrasound scheduled at [facility] at 0945am tomorrow morning; patient will be NPO 6 hours prior to appointment. Patient will have [place] and labs done tomorrow morning and we will follow-up with patient in the office tomorrow.  Called patient to give her this information. All information was reviewed.  Return appointment made with Dr. Dahlia Byes for 1400 tomorrow in [place] office.

## 2016-03-12 ENCOUNTER — Other Ambulatory Visit
Admission: RE | Admit: 2016-03-12 | Discharge: 2016-03-12 | Disposition: A | Discharge status: Home / Self Care | Payer: Medicaid Other | Source: Ambulatory Visit | Attending: Surgery | Admitting: Surgery

## 2016-03-12 ENCOUNTER — Other Ambulatory Visit: Payer: Self-pay

## 2016-03-12 ENCOUNTER — Ambulatory Visit (INDEPENDENT_AMBULATORY_CARE_PROVIDER_SITE_OTHER): Payer: Medicaid Other | Admitting: Surgery

## 2016-03-12 ENCOUNTER — Encounter: Payer: Self-pay | Admitting: Surgery

## 2016-03-12 ENCOUNTER — Ambulatory Visit
Admission: RE | Admit: 2016-03-12 | Discharge: 2016-03-12 | Disposition: A | Discharge status: Home / Self Care | Payer: Medicaid Other | Source: Ambulatory Visit | Attending: Surgery | Admitting: Surgery

## 2016-03-12 VITALS — BP 151/86 | HR 94 | Temp 98.4°F | Ht 66.0 in | Wt 318.4 lb

## 2016-03-12 DIAGNOSIS — K829 Disease of gallbladder, unspecified: Secondary | ICD-10-CM | POA: Diagnosis not present

## 2016-03-12 DIAGNOSIS — Z09 Encounter for follow-up examination after completed treatment for conditions other than malignant neoplasm: Secondary | ICD-10-CM

## 2016-03-12 DIAGNOSIS — R1011 Right upper quadrant pain: Secondary | ICD-10-CM | POA: Insufficient documentation

## 2016-03-12 LAB — COMPREHENSIVE METABOLIC PANEL
ALK PHOS: 109 U/L (ref 38–126)
ALT: 75 U/L — AB (ref 14–54)
ANION GAP: 5 (ref 5–15)
AST: 16 U/L (ref 15–41)
Albumin: 3.4 g/dL — ABNORMAL LOW (ref 3.5–5.0)
BILIRUBIN TOTAL: 0.5 mg/dL (ref 0.3–1.2)
BUN: 11 mg/dL (ref 6–20)
CO2: 26 mmol/L (ref 22–32)
Calcium: 9.1 mg/dL (ref 8.9–10.3)
Chloride: 107 mmol/L (ref 101–111)
Creatinine, Ser: 0.71 mg/dL (ref 0.44–1.00)
GFR calc non Af Amer: >60 mL/min (ref >60)
Glucose, Bld: 101 mg/dL — ABNORMAL HIGH (ref 65–99)
Potassium: 3.6 mmol/L (ref 3.5–5.1)
SODIUM: 138 mmol/L (ref 135–145)
TOTAL PROTEIN: 7.2 g/dL (ref 6.5–8.1)

## 2016-03-12 LAB — CBC WITH DIFFERENTIAL/PLATELET
BASOS ABS: 0.1 10*3/uL (ref 0–0.1)
Basophils Relative: 1 %
EOS ABS: 0.3 10*3/uL (ref 0–0.7)
HCT: 33.5 % — ABNORMAL LOW (ref 35.0–47.0)
Hemoglobin: 11.2 g/dL — ABNORMAL LOW (ref 12.0–16.0)
Lymphs Abs: 3.1 10*3/uL (ref 1.0–3.6)
MCH: 27.1 pg (ref 26.0–34.0)
MCHC: 33.3 g/dL (ref 32.0–36.0)
MCV: 81.4 fL (ref 80.0–100.0)
Monocytes Absolute: 0.9 10*3/uL (ref 0.2–0.9)
Monocytes Relative: 7 %
Neutro Abs: 8.3 10*3/uL — ABNORMAL HIGH (ref 1.4–6.5)
Neutrophils Relative %: 65 %
PLATELETS: 369 10*3/uL (ref 150–440)
RBC: 4.11 MIL/uL (ref 3.80–5.20)
RDW: 15.9 % — ABNORMAL HIGH (ref 11.5–14.5)
WBC: 12.7 10*3/uL — AB (ref 3.6–11.0)

## 2016-03-12 NOTE — Patient Instructions (Signed)
You may return to work on 5/30 with a lifting restriction of no more than 15 lbs.  Please do not use Tylenol until you are seen in the office in 2 weeks.   Please follow-up as scheduled below.   Call with any questions or concerns

## 2016-03-12 NOTE — Progress Notes (Signed)
S/p lap chole 5/13 Patient did well immediately postoperatively and but a few days later was complaining of chest pain. Went to the emergency room and and workup including CT of the chest revealed no evidence of PE more importantly acute abnormalities regarding her gallbladder surgery. She was sent home . That time she had mild elevation of the LFTs. And now she is following up after her surgery. In order a repeat right upper quadrant ultrasound with a small collection in the gallbladder fossa of unknown clinical significance. Normal common bile duct and no definitive evidence of any other abnormalities. Her white count on her LFTs are improved and morning probably her clinical condition is better. She denies any abdominal pain and she is able to take some by mouth she does have some reflux. No evidence of obstructive jaundice. No fevers Increase LFT may be related to passing a stone or hepatitis related to the fatty liver + tylenol. She states after she is off the pain meds her sxs have resolved  PE NAD Abd: soft, NT, incision c/d/i  A/P Doing well F/U 1-2 weeks Wants to RTW but we will give her an excuse for another week If sxs recur may need HIDA to r/o bile leak

## 2016-03-18 ENCOUNTER — Ambulatory Visit: Payer: Self-pay | Admitting: Surgery

## 2016-03-19 ENCOUNTER — Telehealth: Payer: Self-pay | Admitting: General Surgery

## 2016-03-19 NOTE — Telephone Encounter (Signed)
Patient wants to come in before her appointment on 6/2. She is having numbness and is very tired. She is eating ok but not like she should be. Please call.

## 2016-03-19 NOTE — Telephone Encounter (Signed)
Returned phone call to patient at this time. Left voicemail for return phone call.

## 2016-03-23 ENCOUNTER — Telehealth: Payer: Self-pay

## 2016-03-23 MED ORDER — PANTOPRAZOLE SODIUM 40 MG PO TBEC: 40.0000 mg | DELAYED_RELEASE_TABLET | Freq: Every day | ORAL | Status: DC

## 2016-03-23 NOTE — Telephone Encounter (Signed)
Patient returned phone call at this time. Patient states that she is having difficulty holding foods down and her acid reflux is much worse despite using Pepcid. She is vomiting anything that she eats except peas, carrots, and chicken broth.   Spoke with Dr. Dahlia Byes regarding this patient.  He has ordered Pantoprazole 40mg  Daily. This has been sent to her [facility].

## 2016-03-23 NOTE — Telephone Encounter (Signed)
Returned phone call to patient once again at this time. No answer. Left voicemail for return phone call.

## 2016-03-25 ENCOUNTER — Other Ambulatory Visit
Admission: RE | Admit: 2016-03-25 | Discharge: 2016-03-25 | Disposition: A | Discharge status: Home / Self Care | Payer: Medicaid Other | Source: Ambulatory Visit | Attending: General Surgery | Admitting: General Surgery

## 2016-03-25 ENCOUNTER — Ambulatory Visit (INDEPENDENT_AMBULATORY_CARE_PROVIDER_SITE_OTHER): Payer: Medicaid Other | Admitting: General Surgery

## 2016-03-25 ENCOUNTER — Encounter: Payer: Self-pay | Admitting: General Surgery

## 2016-03-25 VITALS — BP 138/97 | HR 81 | Temp 98.1°F | Ht 66.0 in | Wt 316.2 lb

## 2016-03-25 DIAGNOSIS — R1013 Epigastric pain: Secondary | ICD-10-CM

## 2016-03-25 LAB — COMPREHENSIVE METABOLIC PANEL
ALBUMIN: 3.8 g/dL (ref 3.5–5.0)
ALK PHOS: 94 U/L (ref 38–126)
ALT: 54 U/L (ref 14–54)
AST: 26 U/L (ref 15–41)
Anion gap: 7 (ref 5–15)
BILIRUBIN TOTAL: 0.6 mg/dL (ref 0.3–1.2)
BUN: 12 mg/dL (ref 6–20)
CALCIUM: 9.3 mg/dL (ref 8.9–10.3)
CO2: 26 mmol/L (ref 22–32)
CREATININE: 0.75 mg/dL (ref 0.44–1.00)
Chloride: 103 mmol/L (ref 101–111)
GFR calc Af Amer: >60 mL/min (ref >60)
GFR calc non Af Amer: >60 mL/min (ref >60)
GLUCOSE: 92 mg/dL (ref 65–99)
Potassium: 3.9 mmol/L (ref 3.5–5.1)
SODIUM: 136 mmol/L (ref 135–145)
Total Protein: 8 g/dL (ref 6.5–8.1)

## 2016-03-25 LAB — CBC WITH DIFFERENTIAL/PLATELET
Basophils Absolute: 0.1 10*3/uL (ref 0–0.1)
EOS ABS: 0.3 10*3/uL (ref 0–0.7)
HCT: 34.2 % — ABNORMAL LOW (ref 35.0–47.0)
Hemoglobin: 11.6 g/dL — ABNORMAL LOW (ref 12.0–16.0)
Lymphocytes Relative: 24 %
Lymphs Abs: 2.7 10*3/uL (ref 1.0–3.6)
MCH: 27.2 pg (ref 26.0–34.0)
MCHC: 34 g/dL (ref 32.0–36.0)
MCV: 80.1 fL (ref 80.0–100.0)
MONO ABS: 0.8 10*3/uL (ref 0.2–0.9)
Neutro Abs: 7.2 10*3/uL — ABNORMAL HIGH (ref 1.4–6.5)
Neutrophils Relative %: 64 %
PLATELETS: 425 10*3/uL (ref 150–440)
RBC: 4.27 MIL/uL (ref 3.80–5.20)
RDW: 15.8 % — AB (ref 11.5–14.5)
WBC: 11 10*3/uL (ref 3.6–11.0)

## 2016-03-25 MED ORDER — ONDANSETRON 4 MG PO TBDP: 4.0000 mg | ORAL_TABLET | Freq: Three times a day (TID) | ORAL | Status: DC | PRN

## 2016-03-25 NOTE — Patient Instructions (Signed)
Your HIDA scan appointment is scheduled for 04/01/16 @ 9:45.  Nothing to eat or drink after midnight on 03/31/16. We will need you to go to the Lab  At [facility] to have your blood work done today. We have called in your medicine to your pharmacy. We have your appointment listed below to discuss your HIDA Scan results. Please call our office if you have any questions or concerns.

## 2016-03-25 NOTE — Progress Notes (Signed)
Outpatient Surgical Follow Up  03/25/2016  Maria Dean is an 35 y.o. female.   Chief Complaint  Patient presents with  . Routine Post Op    laparoscopic Cholecystitis    HPI: 35 year old female returns to clinic for evaluation of recurrent upper abdominal midepigastric pain with nausea and vomiting after eating. She is approximately 3 weeks status post laparoscopic cholecystectomy and had worsening of her symptoms last weekend over the weekend. She states she can only keep down brought and ginger ale. She denies any fevers, chills, diarrhea, constipation but she is having persistent nausea and vomiting after eating. She was previously evaluated with an ultrasound which showed a fluid collection under her liver but with improved labs.  Past Medical History  Diagnosis Date  . Hypertension   . Chest pain     Past Surgical History  Procedure Laterality Date  . Cesarean section    . Leep    . Cholecystectomy N/A 03/06/2016    Procedure: LAPAROSCOPIC CHOLECYSTECTOMY, LIVER BIOPSY;  Surgeon: Jules Husbands, MD;  Location: [facility] ORS;  Service: General;  Laterality: N/A;    Family History  Problem Relation Age of Onset  . Hypertension Other   . Hypertension Mother     Social History:  reports that she has never smoked. She has never used smokeless tobacco. She reports that she does not drink alcohol or use illicit drugs.  Allergies:  Allergies  Allergen Reactions  . Orphenadrine Shortness Of Breath  . Ephedrine Nausea And Vomiting    Medications reviewed.    ROS A multipoint review of systems was completed. All pertinent positives and negatives are documented in the history of present illness and remainder are negative.   BP 138/97 mmHg  Pulse 81  Temp(Src) 98.1 F (36.7 C) (Oral)  Ht 5\' 6"  (1.676 m)  Wt 143.427 kg (316 lb 3.2 oz)  BMI 51.06 kg/m2  Physical Exam Gen.: No acute distress Chest: Clear to auscultation Heart: Regular rhythm Abdomen: Soft, minimally  tender to palpation along the incision sites, nondistended. Well approximated laparoscopic cholecystectomy sites without evidence of erythema or drainage.    No results found for this or any previous visit (from the past 48 hour(s)). No results found.  Assessment/Plan:  1. Abdominal pain, epigastric 35 year old female with recurrent midepigastric pain and nausea and vomiting in the postoperative setting after a cholecystectomy. Previous ultrasound showed a perihepatic fluid collection. With recurrence of her symptoms the next test to obtain is a HIDA scan. We will repeat her labs and obtain a HIDA scan of the next available opportunity outpatient follow-up in clinic after this workup is completed. Discussed she may require an endoscopic procedure should there be evidence of a bile leak. All questions answered for the patient at this time. - NM Hepato W/EjeCT Fract; Future - CBC with Differential - Comprehensive metabolic panel     Clayburn Pert, MD [facility] General Surgeon  03/25/2016,11:52 AM

## 2016-03-26 ENCOUNTER — Encounter: Payer: Self-pay | Admitting: General Surgery

## 2016-03-30 ENCOUNTER — Other Ambulatory Visit: Payer: Self-pay

## 2016-03-30 DIAGNOSIS — Z01818 Encounter for other preprocedural examination: Secondary | ICD-10-CM

## 2016-03-31 ENCOUNTER — Other Ambulatory Visit
Admission: RE | Admit: 2016-03-31 | Discharge: 2016-03-31 | Disposition: A | Discharge status: Home / Self Care | Payer: Medicaid Other | Source: Ambulatory Visit | Attending: General Surgery | Admitting: General Surgery

## 2016-03-31 ENCOUNTER — Other Ambulatory Visit: Payer: Self-pay

## 2016-03-31 DIAGNOSIS — R1013 Epigastric pain: Secondary | ICD-10-CM | POA: Diagnosis present

## 2016-03-31 LAB — PREGNANCY, URINE: Preg Test, Ur: NEGATIVE

## 2016-03-31 NOTE — Progress Notes (Signed)
Received call from Nuclear Medicine asking for Order to be without CCK and also patient will need to have Urine HCG done today prior to testing tomorrow. Order has been changed. Spoke with Peggy in [place] to change this order over. Patient's LMP is 10/28/15. Placed orders for HCG and did inform patient that she would have to go to [place] or [place] to have this done today or testing would have to be cancelled. Patient verbalizes understanding of this information.

## 2016-04-01 ENCOUNTER — Ambulatory Visit
Admission: RE | Admit: 2016-04-01 | Discharge: 2016-04-01 | Disposition: A | Discharge status: Home / Self Care | Payer: Medicaid Other | Source: Ambulatory Visit | Attending: General Surgery | Admitting: General Surgery

## 2016-04-01 ENCOUNTER — Ambulatory Visit: Admission: RE | Admit: 2016-04-01 | Payer: Medicaid Other | Source: Ambulatory Visit

## 2016-04-01 DIAGNOSIS — R1013 Epigastric pain: Secondary | ICD-10-CM | POA: Diagnosis present

## 2016-04-01 MED ORDER — TECHNETIUM TC 99M MEBROFENIN IV KIT
5.0000 | PACK | Freq: Once | INTRAVENOUS | Status: AC | PRN
  Administered 2016-04-01: 5.25 via INTRAVENOUS

## 2016-04-06 ENCOUNTER — Encounter (INDEPENDENT_AMBULATORY_CARE_PROVIDER_SITE_OTHER): Payer: Self-pay

## 2016-04-06 ENCOUNTER — Encounter: Payer: Self-pay | Admitting: General Surgery

## 2016-04-06 ENCOUNTER — Ambulatory Visit (INDEPENDENT_AMBULATORY_CARE_PROVIDER_SITE_OTHER): Payer: Medicaid Other | Admitting: General Surgery

## 2016-04-06 VITALS — BP 167/98 | HR 98 | Temp 98.4°F | Ht 66.0 in | Wt 323.0 lb

## 2016-04-06 DIAGNOSIS — Z4889 Encounter for other specified surgical aftercare: Secondary | ICD-10-CM

## 2016-04-06 NOTE — Patient Instructions (Signed)

## 2016-04-06 NOTE — Progress Notes (Signed)
Outpatient Surgical Follow Up  04/06/2016  Maria Dean is an 35 y.o. female.   Chief Complaint  Patient presents with  . Routine Post Op    Cholecystectomy-03/06/16 Dr. Dahlia Byes    HPI: 35 year old female returns to clinic for follow-up 1 month status post laparoscopic cholecystectomy. Patient states that the pain complaint she was having her last visit has completely resolved. She denies any fevers, chills, nausea, vomiting, diarrhea, constipation. She's been very happy with her surgical experience. She does continue to have some numbness to the umbilical incision site however this is also improving. She strongly desires to return to work.  Past Medical History  Diagnosis Date  . Hypertension   . Chest pain     Past Surgical History  Procedure Laterality Date  . Cesarean section    . Leep    . Cholecystectomy N/A 03/06/2016    Procedure: LAPAROSCOPIC CHOLECYSTECTOMY, LIVER BIOPSY;  Surgeon: Jules Husbands, MD;  Location: [facility] ORS;  Service: General;  Laterality: N/A;    Family History  Problem Relation Age of Onset  . Hypertension Other   . Hypertension Mother     Social History:  reports that she has never smoked. She has never used smokeless tobacco. She reports that she does not drink alcohol or use illicit drugs.  Allergies:  Allergies  Allergen Reactions  . Orphenadrine Shortness Of Breath  . Ephedrine Nausea And Vomiting    Medications reviewed.    ROS A multipoint review of systems was completed. All pertinent positives and negatives are documented within the history of present illness and remainder are negative.   BP 167/98 mmHg  Pulse 98  Temp(Src) 98.4 F (36.9 C) (Oral)  Ht 5\' 6"  (1.676 m)  Wt 146.512 kg (323 lb)  BMI 52.16 kg/m2  Physical Exam  Gen.: No acute distress Chest: Clear to auscultation Heart: Regular rhythm Abdomen: Soft, nontender, nondistended. Well approximated and healing laparoscopic cholecystectomy incisions.   No  results found for this or any previous visit (from the past 48 hour(s)). No results found.  Assessment/Plan:  1. Aftercare following surgery 35 year old female status post laparoscopic cholecystectomy. She had labs and a HIDA exam after her last visit which were all within normal limits. She is much improved. Provided with standard precautions for wound healing as well as signs and symptoms of infection or hernia formation and return to clinic immediately should they occur. She voiced understanding and will follow-up in clinic on an as-needed basis.     Clayburn Pert, MD FACS General Surgeon  04/06/2016,2:58 PM

## 2016-10-21 IMAGING — CR DG CHEST 2V
1 series · 2 of 2 positions shown · non-contrast
Comparison: 08/13/2015

CLINICAL DATA: Midsternal chest pain and headache, onset this
evening.

EXAM:
CHEST  2 VIEW

[Series 1: dg chest 2 view · 0.14mm/px · 2 of 2 slices shown]
[im 1/2]
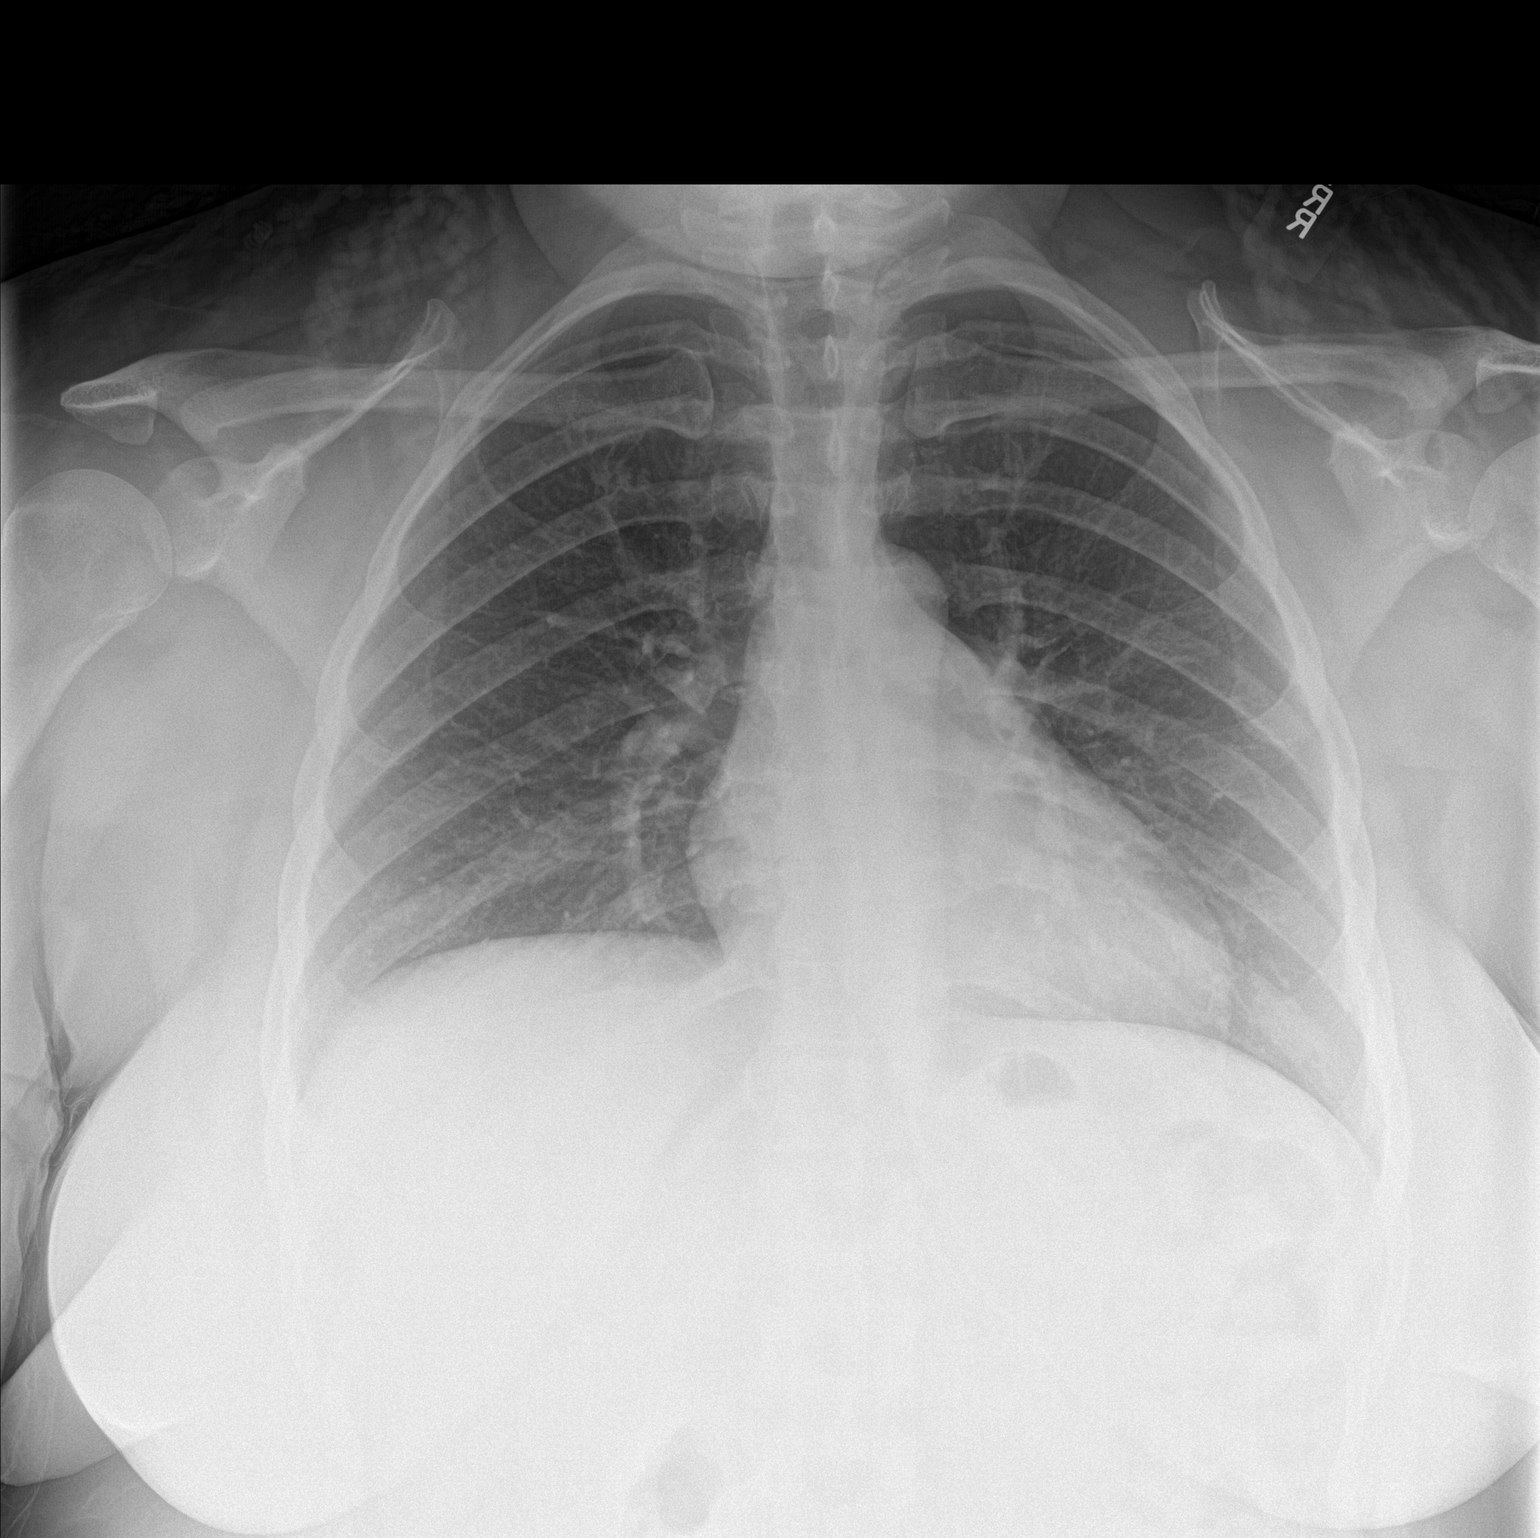
[im 2/2]
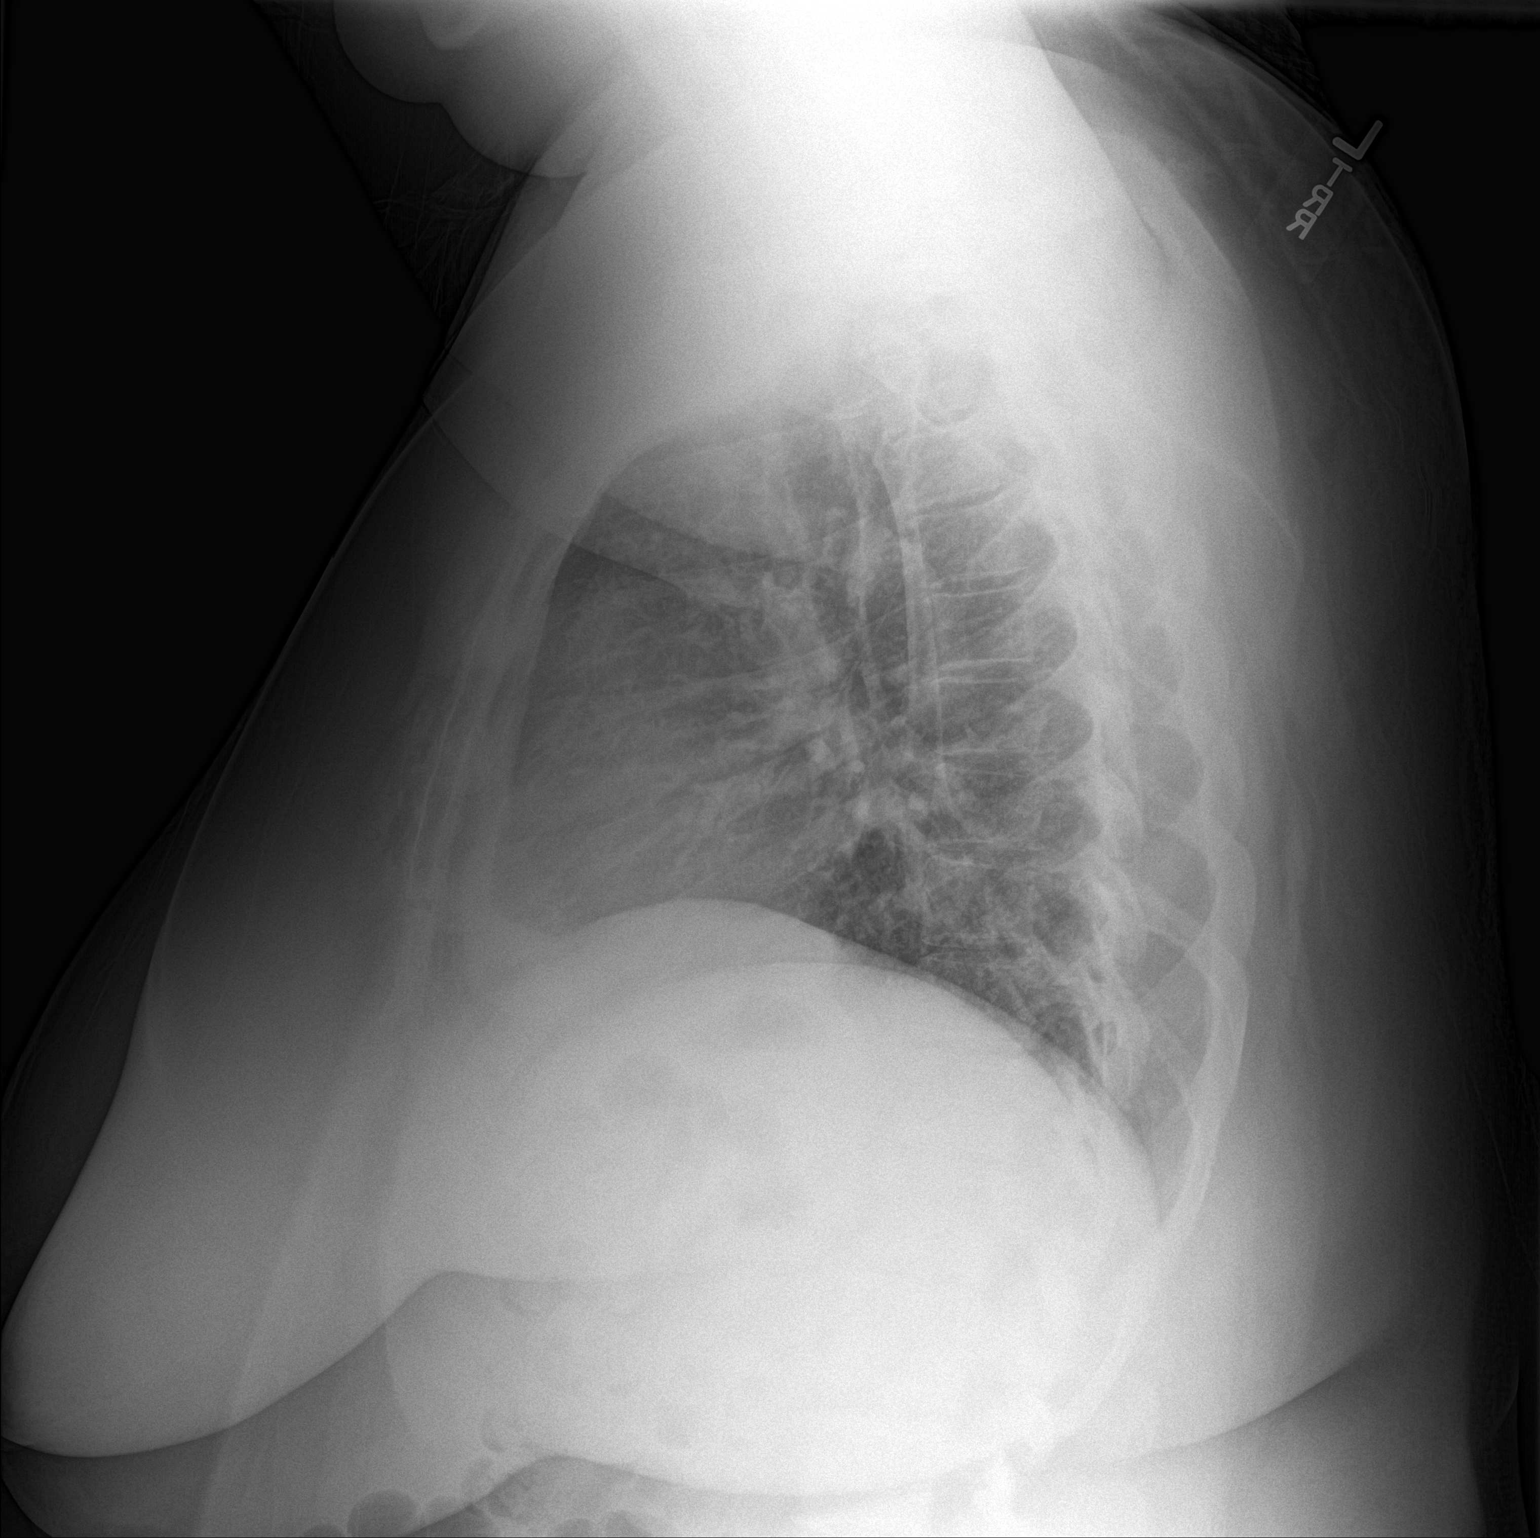

[2 of 2 positions shown; findings below may reference images not displayed]

FINDINGS: The lungs are clear. The pulmonary vasculature is normal. Heart size
is normal. Hilar and mediastinal contours are unremarkable. There is
no pleural effusion.
IMPRESSION: No active cardiopulmonary disease.

## 2016-10-25 IMAGING — CT CT ANGIO CHEST
2 of 6 series · 18 of 46 positions shown · IV contrast (APPLIED)
Comparison: Chest CT August 14, 2015; chest radiograph March 05, 2016

CLINICAL DATA: Shortness of breath and dizziness. Recent
cholecystectomy

EXAM:
CT ANGIOGRAPHY CHEST WITH CONTRAST
TECHNIQUE: Multidetector CT imaging of the chest was performed using the
standard protocol during bolus administration of intravenous
contrast. Multiplanar CT image reconstructions and MIPs were
obtained to evaluate the vascular anatomy.
CONTRAST:  100 mL Isovue 370 nonionic

[Series 5: pe thins 1.5 · axial · 0.68mm/px · z∈[-342,-93]mm · 15 of 233 slices shown]
[im 13/233  lung]
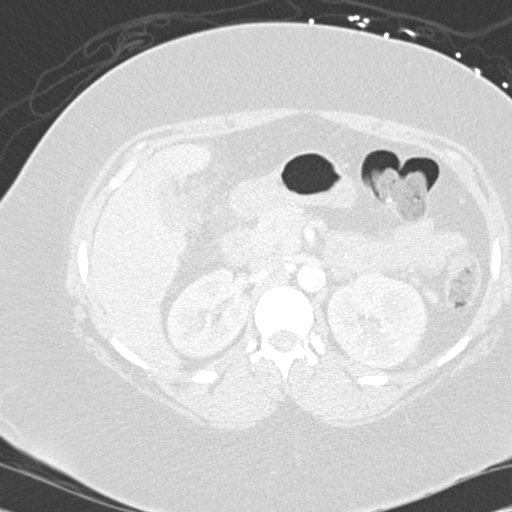
[im 25/233  soft-tissue]
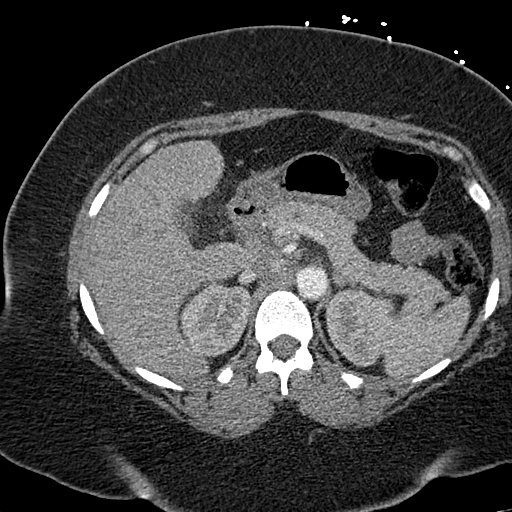
[im 49/233  lung]
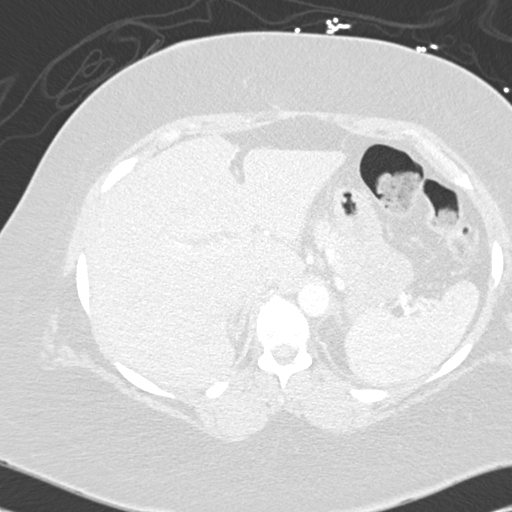
[im 62/233  soft-tissue]
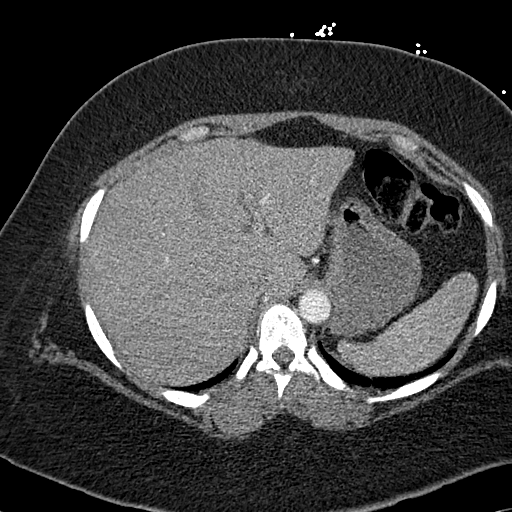
[im 74/233  lung]
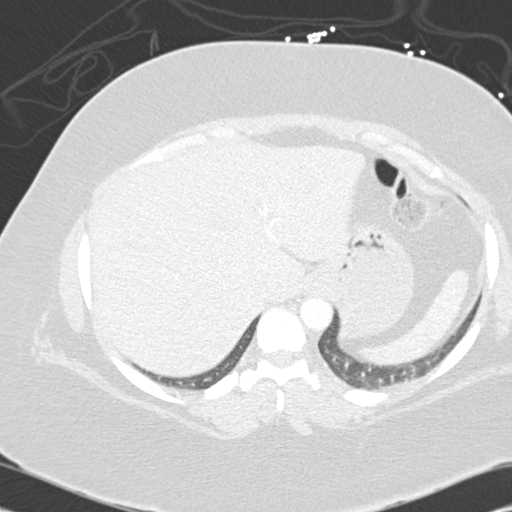
[im 86/233  soft-tissue]
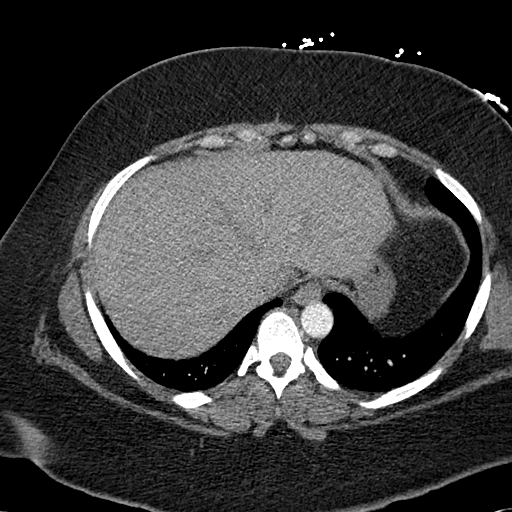
[im 98/233  lung]
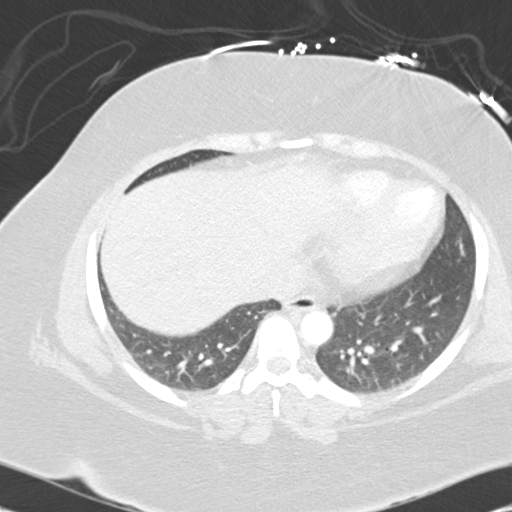
[im 123/233  soft-tissue]
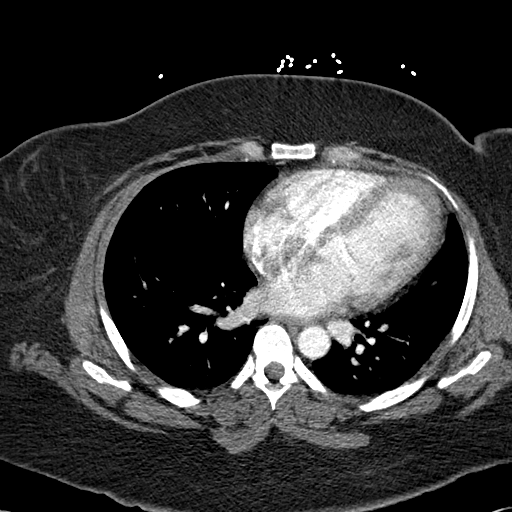
[im 135/233  lung]
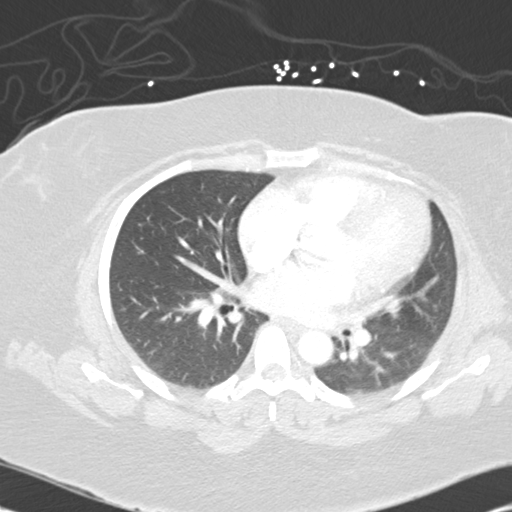
[im 147/233  soft-tissue]
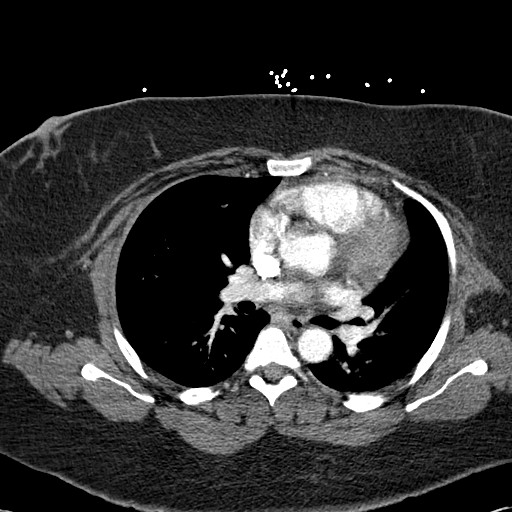
[im 159/233  lung]
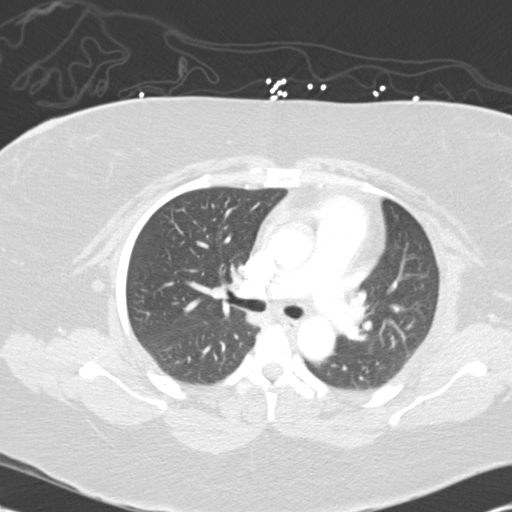
[im 171/233  soft-tissue]
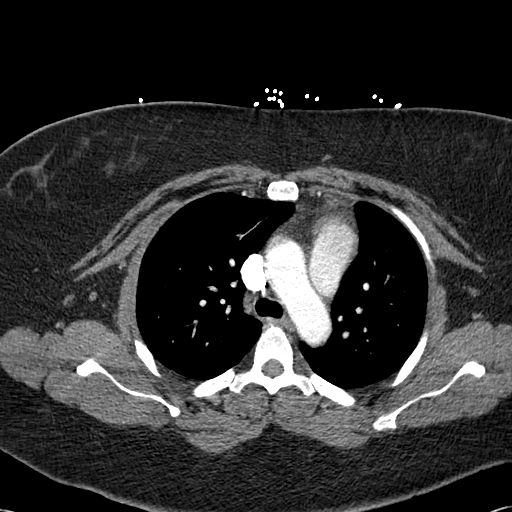
[im 196/233  lung]
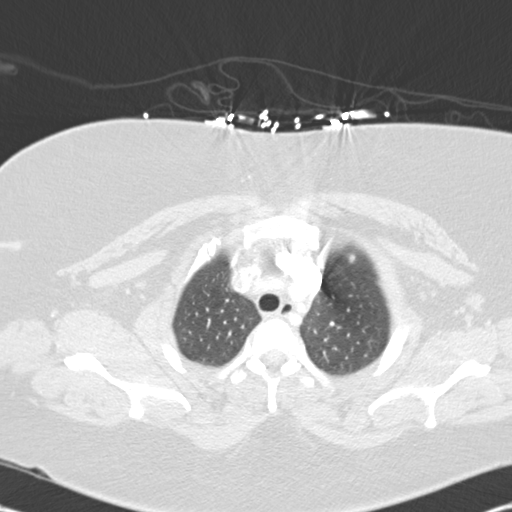
[im 208/233  soft-tissue]
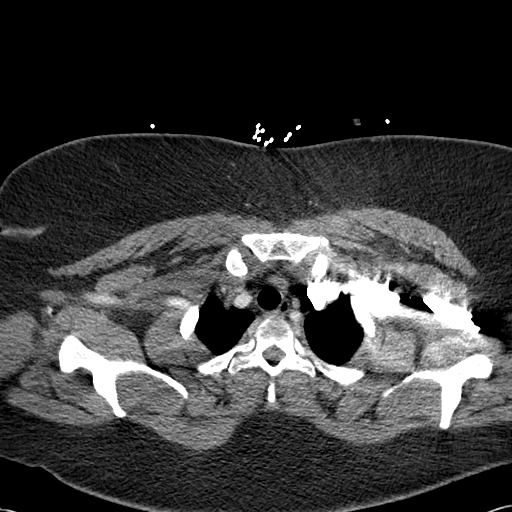
[im 220/233  lung]
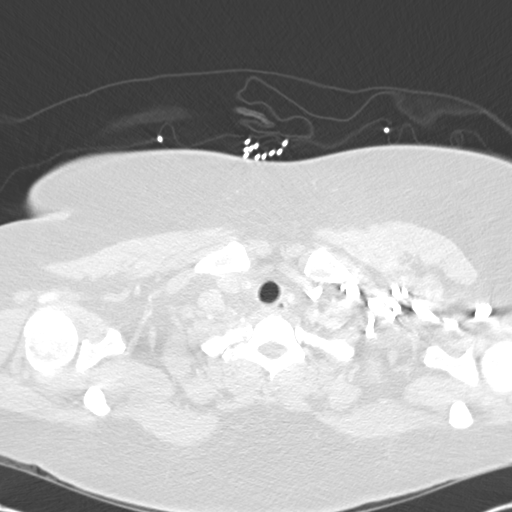

[Series 7: cor mpr 2.0 · coronal · 0.53mm/px · 3 of 139 slices shown]
[im 35/139  soft-tissue]
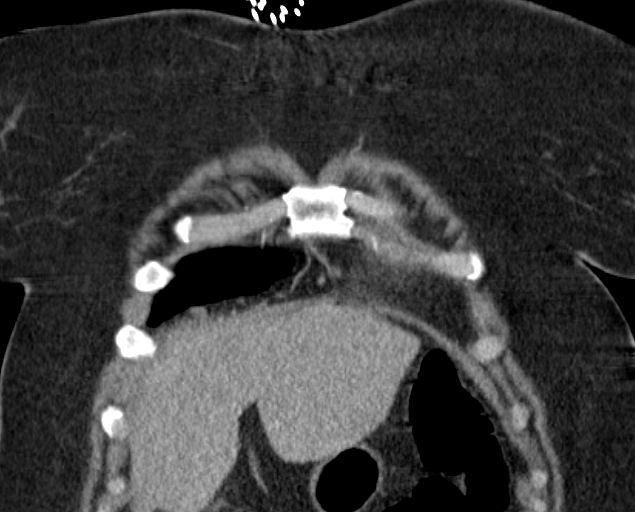
[im 70/139  soft-tissue]
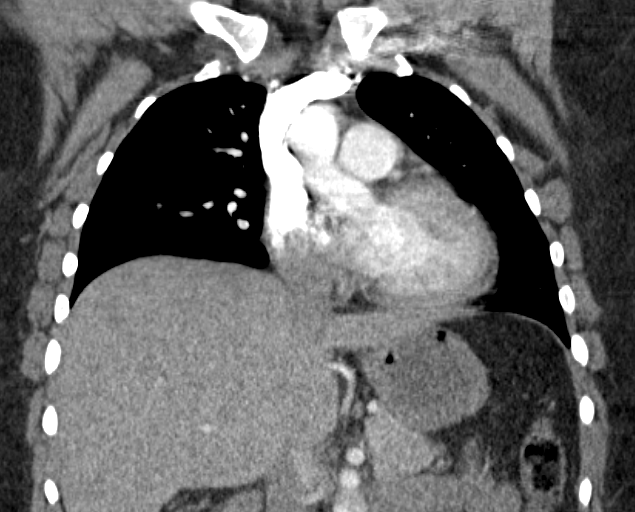
[im 104/139  soft-tissue]
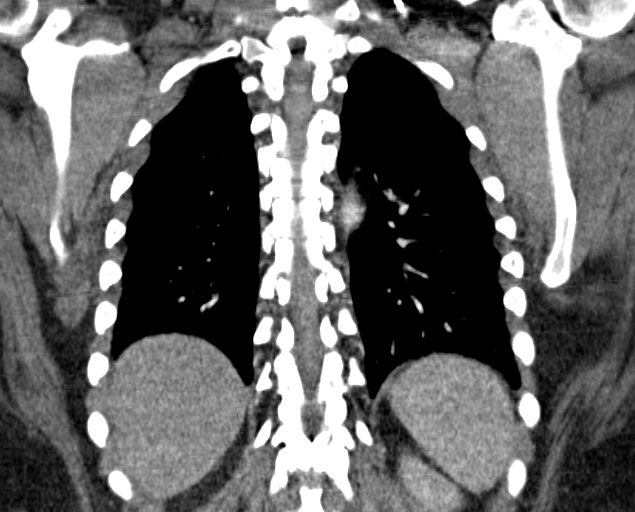

[18 of 46 positions shown; findings below may reference images not displayed]

FINDINGS: Mediastinum/Lymph Nodes: There is no demonstrable pulmonary embolus.
There is no thoracic aortic aneurysm or dissection. The visualized
great vessels appear unremarkable. Pericardium is not appreciably
thickened.

Visualized thyroid appears unremarkable. There is no appreciable
thoracic adenopathy. Several subcentimeter axillary lymph nodes are
noted bilaterally.

Lungs/Pleura: There is no parenchymal lung edema or consolidation.
No pleural effusion.

Upper abdomen: Gallbladder is absent. Mild stranding in the
gallbladder fossa is consistent with recent surgery in this area.
Visualized upper abdominal structures otherwise appear unremarkable.

Musculoskeletal: There are no blastic or lytic bone lesions.

Review of the MIP images confirms the above findings.
IMPRESSION: No demonstrable pulmonary embolus. No parenchymal lung edema or
consolidation. No adenopathy. Postoperative change in the
gallbladder fossa region.

## 2016-11-04 ENCOUNTER — Emergency Department
Admission: EM | Admit: 2016-11-04 | Discharge: 2016-11-05 | Disposition: A | Discharge status: Home / Self Care | Payer: Medicaid Other | Attending: Student in an Organized Health Care Education/Training Program | Admitting: Student in an Organized Health Care Education/Training Program

## 2016-11-04 ENCOUNTER — Emergency Department: Payer: Medicaid Other

## 2016-11-04 DIAGNOSIS — M25561 Pain in right knee: Secondary | ICD-10-CM | POA: Insufficient documentation

## 2016-11-04 DIAGNOSIS — I10 Essential (primary) hypertension: Secondary | ICD-10-CM | POA: Insufficient documentation

## 2016-11-04 DIAGNOSIS — Z79899 Other long term (current) drug therapy: Secondary | ICD-10-CM | POA: Diagnosis not present

## 2016-11-04 MED ORDER — NAPROXEN 500 MG PO TBEC: 500.0000 mg | DELAYED_RELEASE_TABLET | Freq: Two times a day (BID) | ORAL | 2 refills | Status: AC

## 2016-11-04 MED ORDER — NAPROXEN 500 MG PO TABS
500.0000 mg | ORAL_TABLET | Freq: Once | ORAL | Status: AC
  Administered 2016-11-04: 500 mg via ORAL
  Filled 2016-11-04: qty 1

## 2016-11-04 NOTE — ED Triage Notes (Signed)
Pt reports hx of injuring right knee when falling X 2 months ago. Approx 1 week ago she fell again and caused right knee pain. Pt states pain to right knee currently. No obvious deformity or swelling.

## 2016-11-05 NOTE — ED Provider Notes (Signed)
[facility] Emergency Department Provider Note  ____________________________________________  Time seen: Approximately 1:16 AM  I have reviewed the triage vital signs and the nursing notes.   HISTORY  Chief Complaint Knee Pain    HPI Maria Dean is a 36 y.o. female presenting to the emergency department with right knee pain. Patient states that she has sustained 2 falls within the last 2 months. Last fall occurred one week ago. Patient currently rates her knee pain at 3 out of 10 in intensity and describes it as aching. Knee pain is worse with ambulation. Patient states that pain was severe enough tonight that she had to leave work early. No alleviating measures haven't been attempted.  Past Medical History:  Diagnosis Date  . Chest pain   . Hypertension     Patient Active Problem List   Diagnosis Date Noted  . Abdominal pain 03/06/2016  . Calculus of gallbladder without cholecystitis without obstruction   . Intracranial meningioma ([place]) 08/20/2015  . Chest pain, central 08/13/2015  . Bipolar affective disorder, currently depressed, mild ([place]) 05/14/2015  . Morbid (severe) obesity due to excess calories ([place]) 09/03/2014  . Cervical dysplasia, moderate 08/19/2014  . BP (high blood pressure) 07/16/2014  . Headache, migraine 07/16/2014  . Depression, postpartum 07/16/2014    Past Surgical History:  Procedure Laterality Date  . CESAREAN SECTION    . CHOLECYSTECTOMY N/A 03/06/2016   Procedure: LAPAROSCOPIC CHOLECYSTECTOMY, LIVER BIOPSY;  Surgeon: Jules Husbands, MD;  Location: [facility] ORS;  Service: General;  Laterality: N/A;  . LEEP      Prior to Admission medications   Medication Sig Start Date End Date Taking? Authorizing Provider  aluminum-magnesium hydroxide-simethicone (MAALOX) [id number] MG/5ML SUSP Take 30 mLs by mouth 4 (four) times daily -  before meals and at bedtime. 03/09/16   Carrie Mew, MD  amLODipine (NORVASC) 10 MG tablet Take  1 tablet by mouth 1 day or 1 dose. 02/26/16   Historical Provider, MD  butalbital-acetaminophen-caffeine (FIORICET, ESGIC) 50-325-40 MG tablet Take 1 tablet by mouth every 6 (six) hours as needed for headache or migraine. 08/15/15   Vaughan Basta, MD  cephALEXin (KEFLEX) 500 MG capsule Take 1 capsule (500 mg total) by mouth 3 (three) times daily. 03/09/16   Carrie Mew, MD  fluticasone [facility]) 50 MCG/ACT nasal spray Place 1 spray into both nostrils 1 day or 1 dose. 01/16/16   Historical Provider, MD  HYDROcodone-acetaminophen (NORCO) 7.5-325 MG tablet Take 1-2 tablets by mouth every 4 (four) hours as needed for moderate pain. 03/07/16   Diego F Pabon, MD  lisinopril (PRINIVIL,ZESTRIL) 10 MG tablet Take 10 mg by mouth daily.    Historical Provider, MD  metoprolol-hydrochlorothiazide (LOPRESSOR HCT) 100-25 MG tablet Take 1 tablet by mouth daily.    Historical Provider, MD  naproxen (EC NAPROSYN) 500 MG EC tablet Take 1 tablet (500 mg total) by mouth 2 (two) times daily with a meal. 11/04/16 11/19/16  Lannie Fields, PA-C  ondansetron (ZOFRAN ODT) 4 MG disintegrating tablet Take 1 tablet (4 mg total) by mouth every 8 (eight) hours as needed for nausea or vomiting. 03/25/16   Clayburn Pert, MD  pantoprazole (PROTONIX) 40 MG tablet Take 1 tablet (40 mg total) by mouth daily. 03/23/16   Diego F Pabon, MD    Allergies Orphenadrine and Ephedrine  Family History  Problem Relation Age of Onset  . Hypertension Mother   . Hypertension Other     Social History Social History  Substance Use Topics  .  Smoking status: Never Smoker  . Smokeless tobacco: Never Used  . Alcohol use No     Review of Systems  Constitutional: No fever/chills Cardiovascular: no chest pain. Respiratory: no cough. No SOB. Musculoskeletal: Patient has right knee pain.  Skin: Negative for rash, abrasions, lacerations, ecchymosis. Neurological: Negative for headaches, focal weakness or numbness. 10-point ROS otherwise  negative.  ____________________________________________   PHYSICAL EXAM:  VITAL SIGNS: ED Triage Vitals  Enc Vitals Group     BP 11/04/16 2135 (!) 166/107     Pulse Rate 11/04/16 2135 91     Resp 11/04/16 2135 18     Temp 11/04/16 2135 98.1 F (36.7 C)     Temp Source 11/04/16 2135 Oral     SpO2 11/04/16 2135 100 %     Weight 11/04/16 2136 (!) 315 lb (142.9 kg)     Height 11/04/16 2136 5\' 6"  (1.676 m)     Head Circumference --      Peak Flow --      Pain Score 11/04/16 2138 4     Pain Loc --      Pain Edu? --      Excl. in [place]? --      Constitutional: Alert and oriented. Well appearing and in no acute distress. Cardiovascular: Normal rate, regular rhythm. Normal S1 and S2.  Good peripheral circulation. Respiratory: Normal respiratory effort without tachypnea or retractions. Lungs CTAB. Good air entry to the bases with no decreased breath sounds.  Musculoskeletal: Patient has 5 out of 5 strength in the lower extremities bilaterally. Patient has full range of motion at the right hip and right ankle. Patient is able to flex and extend her right knee. Patient has pain with testing the anterior cruciate ligament, right. Negative posterior drawer test. No pain elicited with McMurray's. Negative ballottement, right. Pain is elicited with palpation along medial joint line. Palpable dorsalis pedis pulse, right. Neurologic:  Normal speech and language. No gross focal neurologic deficits are appreciated. Reflexes are 2+ and symmetric in the lower extremities bilaterally. Skin:  Skin is warm, dry and intact. No rash noted. Psychiatric: Mood and affect are normal. Speech and behavior are normal. Patient exhibits appropriate insight and judgement.   ____________________________________________   LABS (all labs ordered are listed, but only abnormal results are displayed)  Labs Reviewed - No data to  display ____________________________________________  EKG   ____________________________________________  RADIOLOGY Unk Pinto, personally viewed and evaluated these images (plain radiographs) as part of my medical decision making, as well as reviewing the written report by the radiologist.  Dg Tibia/fibula Right  Result Date: 11/04/2016 CLINICAL DATA:  Fall with right knee injury EXAM: RIGHT KNEE - COMPLETE 4+ VIEW; RIGHT TIBIA AND FIBULA - 2 VIEW COMPARISON:  Right knee radiograph 04/22/2015 FINDINGS: No evidence of fracture, dislocation, or joint effusion. No evidence of arthropathy or other focal bone abnormality. Soft tissues are unremarkable. IMPRESSION: 1. No acute fracture or dislocation at the right knee. 2. No fracture or dislocation of the right tibia or fibula. Electronically Signed   By: Ulyses Jarred M.D.   On: 11/04/2016 23:05   Dg Knee Complete 4 Views Right  Result Date: 11/04/2016 CLINICAL DATA:  Fall with right knee injury EXAM: RIGHT KNEE - COMPLETE 4+ VIEW; RIGHT TIBIA AND FIBULA - 2 VIEW COMPARISON:  Right knee radiograph 04/22/2015 FINDINGS: No evidence of fracture, dislocation, or joint effusion. No evidence of arthropathy or other focal bone abnormality. Soft tissues are unremarkable. IMPRESSION: 1.  No acute fracture or dislocation at the right knee. 2. No fracture or dislocation of the right tibia or fibula. Electronically Signed   By: Ulyses Jarred M.D.   On: 11/04/2016 23:05    ____________________________________________    PROCEDURES  Procedure(s) performed:    Procedures    Medications  naproxen (NAPROSYN) tablet 500 mg (500 mg Oral Given 11/04/16 2228)   ____________________________________________   INITIAL IMPRESSION / ASSESSMENT AND PLAN / ED COURSE  Pertinent labs & imaging results that were available during my care of the patient were reviewed by me and considered in my medical decision making (see chart for details).  Review of  the [place] CSRS was performed in accordance of the [place] prior to dispensing any controlled drugs.  Clinical Course    assessment and plan: Right Knee Pain Patient presents to the emergency department with right knee pain after experiencing 2 falls within the last month. DG right knee reveals no fractures, dislocations or acute bony abnormalities. Patient was referred to orthopedics, Dr. Sabra Heck. She was prescribed naproxen to be used as needed for pain and inflammation at discharge. A work note was given. All patient questions were answered. ____________________________________________  FINAL CLINICAL IMPRESSION(S) / ED DIAGNOSES  Final diagnoses:  Acute pain of right knee      NEW MEDICATIONS STARTED DURING THIS VISIT:  Discharge Medication List as of 11/04/2016 11:53 PM    START taking these medications   Details  naproxen (EC NAPROSYN) 500 MG EC tablet Take 1 tablet (500 mg total) by mouth 2 (two) times daily with a meal., Starting Thu 11/04/2016, Until Fri 11/19/2016, Print            This chart was dictated using voice recognition software/Dragon. Despite best efforts to proofread, errors can occur which can change the meaning. Any change was purely unintentional.    Lannie Fields, PA-C 11/05/16 0126    Merlyn Lot, MD 11/09/16 [phone]

## 2016-11-05 NOTE — ED Notes (Signed)
Pt alert and oriented X4, active, cooperative, pt in NAD. RR even and unlabored, color WNL.  Pt informed to return if any life threatening symptoms occur.  Pt requesting to leave before discharge VS. States she is tired

## 2016-11-17 IMAGING — NM NM HEPATOBILIARY IMAGE, INC GB
1 series · 6 of 6 positions shown · non-contrast
Comparison: Ultrasound 03/12/2016

CLINICAL DATA: Pain post cholecystectomy 03/06/2016. Fluid
collection noted in gallbladder fossa.

EXAM:
NUCLEAR MEDICINE HEPATOBILIARY IMAGING
TECHNIQUE: Sequential images of the abdomen were obtained [DATE] minutes
following intravenous administration of radiopharmaceutical.
RADIOPHARMACEUTICALS:  5.25 mCi Pc-11m  Choletec IV

[Series 1000: hepatobiliary dynamic · 9.59mm/px · 6 of 59 frames shown]
[frame 5/59]
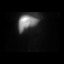
[frame 15/59]
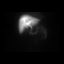
[frame 25/59]
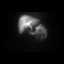
[frame 35/59]
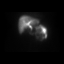
[frame 45/59]
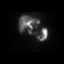
[frame 55/59]
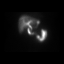

[6 of 6 positions shown; findings below may reference images not displayed]

FINDINGS: Prompt uptake and biliary excretion of activity by the liver is
seen. No evidence of abnormal accumulation in the gallbladder fossa
or peritoneal cavity. Biliary activity passes into small bowel,
consistent with patent common bile duct.
IMPRESSION: 1. Normal post cholecystectomy.  No leak.

## 2017-06-23 ENCOUNTER — Encounter: Payer: Self-pay | Admitting: Emergency Medicine

## 2017-06-23 ENCOUNTER — Emergency Department: Payer: Medicaid Other

## 2017-06-23 ENCOUNTER — Emergency Department
Admission: EM | Admit: 2017-06-23 | Discharge: 2017-06-23 | Disposition: A | Discharge status: Home / Self Care | Payer: Medicaid Other | Attending: Emergency Medicine | Admitting: Emergency Medicine

## 2017-06-23 DIAGNOSIS — Z8719 Personal history of other diseases of the digestive system: Secondary | ICD-10-CM | POA: Insufficient documentation

## 2017-06-23 DIAGNOSIS — N938 Other specified abnormal uterine and vaginal bleeding: Secondary | ICD-10-CM | POA: Diagnosis present

## 2017-06-23 DIAGNOSIS — R102 Pelvic and perineal pain unspecified side: Secondary | ICD-10-CM

## 2017-06-23 DIAGNOSIS — Z30431 Encounter for routine checking of intrauterine contraceptive device: Secondary | ICD-10-CM | POA: Insufficient documentation

## 2017-06-23 DIAGNOSIS — I10 Essential (primary) hypertension: Secondary | ICD-10-CM | POA: Diagnosis not present

## 2017-06-23 DIAGNOSIS — Z87410 Personal history of cervical dysplasia: Secondary | ICD-10-CM | POA: Insufficient documentation

## 2017-06-23 DIAGNOSIS — D259 Leiomyoma of uterus, unspecified: Secondary | ICD-10-CM

## 2017-06-23 LAB — URINALYSIS, COMPLETE (UACMP) WITH MICROSCOPIC
BACTERIA UA: NONE SEEN
Bilirubin Urine: NEGATIVE
GLUCOSE, UA: NEGATIVE mg/dL
KETONES UR: NEGATIVE mg/dL
Nitrite: NEGATIVE
PROTEIN: 30 mg/dL — AB
Specific Gravity, Urine: 1.024 (ref 1.005–1.030)
pH: 6 (ref 5.0–8.0)

## 2017-06-23 LAB — BASIC METABOLIC PANEL
Anion gap: 8 (ref 5–15)
BUN: 12 mg/dL (ref 6–20)
CALCIUM: 9 mg/dL (ref 8.9–10.3)
CHLORIDE: 104 mmol/L (ref 101–111)
CO2: 24 mmol/L (ref 22–32)
CREATININE: 0.7 mg/dL (ref 0.44–1.00)
GFR calc non Af Amer: >60 mL/min (ref >60)
GLUCOSE: 122 mg/dL — AB (ref 65–99)
Potassium: 3.3 mmol/L — ABNORMAL LOW (ref 3.5–5.1)
Sodium: 136 mmol/L (ref 135–145)

## 2017-06-23 LAB — CBC
HEMATOCRIT: 36.4 % (ref 35.0–47.0)
HEMOGLOBIN: 12.5 g/dL (ref 12.0–16.0)
MCH: 28.1 pg (ref 26.0–34.0)
MCHC: 34.2 g/dL (ref 32.0–36.0)
MCV: 82.1 fL (ref 80.0–100.0)
Platelets: 344 10*3/uL (ref 150–440)
RBC: 4.43 MIL/uL (ref 3.80–5.20)
RDW: 14.9 % — AB (ref 11.5–14.5)
WBC: 11.7 10*3/uL — ABNORMAL HIGH (ref 3.6–11.0)

## 2017-06-23 LAB — POCT PREGNANCY, URINE: Preg Test, Ur: NEGATIVE

## 2017-06-23 LAB — HCG, QUANTITATIVE, PREGNANCY

## 2017-06-23 NOTE — ED Notes (Signed)
Long lavender tube sent to lab if needed.

## 2017-06-23 NOTE — ED Notes (Signed)
DR. Lucita Lora made aware of pts BP upon discharge vitals, instructions given to pt to follow up with PCP, pt states understanding

## 2017-06-23 NOTE — ED Notes (Signed)
EDP at bedside  

## 2017-06-23 NOTE — ED Triage Notes (Signed)
Pt reports heavy intermittent vaginal bleeding for three months. Pt reports has IUD and was going to be taken out but MD could not find it. Pt reports this episode of bleeding began suddenly last night. Pt reports having to change pad multiple times per hour. Denies passing any clots. Pt reports last sexual activity two months ago.

## 2017-06-23 NOTE — ED Notes (Signed)
Pt states vaginal bleeding for 3 months on and off, states she tried to have her IUD removed but they were unable to find it, states yesterday an episode of heavy vaginal bleeding, awake and alert in no acute distress

## 2017-06-23 NOTE — ED Provider Notes (Signed)
[facility] Emergency Department Provider Note  ____________________________________________   First MD Initiated Contact with Patient 06/23/17 1132     (approximate)  I have reviewed the triage vital signs and the nursing notes.   HISTORY  Chief Complaint Vaginal Bleeding   HPI Maria Dean is a 36 y.o. female with a history of bipolar disorder with an IUD was presenting to the emergency department today with several months of intermittent vaginal bleeding with sharp, sudden and short-lived abdominal pain. Says the pain is to the lower abdomen. She is concerned that her IUD may be out of place. She had a doctor's appointment this past May where she had her IUD examined but the physician was not able to find the strings. She says that her bleeding happens intermittently but she had a "gush" of blood last night. She says that she frequently than has spotting after the gushes of blood. She is not reporting any nausea or vomiting at this time.   Past Medical History:  Diagnosis Date  . Chest pain   . Hypertension     Patient Active Problem List   Diagnosis Date Noted  . Abdominal pain 03/06/2016  . Calculus of gallbladder without cholecystitis without obstruction   . Intracranial meningioma ([place]) 08/20/2015  . Chest pain, central 08/13/2015  . Bipolar affective disorder, currently depressed, mild ([place]) 05/14/2015  . Morbid (severe) obesity due to excess calories ([place]) 09/03/2014  . Cervical dysplasia, moderate 08/19/2014  . BP (high blood pressure) 07/16/2014  . Headache, migraine 07/16/2014  . Depression, postpartum 07/16/2014    Past Surgical History:  Procedure Laterality Date  . CESAREAN SECTION    . CHOLECYSTECTOMY N/A 03/06/2016   Procedure: LAPAROSCOPIC CHOLECYSTECTOMY, LIVER BIOPSY;  Surgeon: Jules Husbands, MD;  Location: [facility] ORS;  Service: General;  Laterality: N/A;  . LEEP      Prior to Admission medications   Medication Sig  Start Date End Date Taking? Authorizing Provider  aluminum-magnesium hydroxide-simethicone (MAALOX) [phone] MG/5ML SUSP Take 30 mLs by mouth 4 (four) times daily -  before meals and at bedtime. Patient not taking: Reported on 06/23/2017 03/09/16   Carrie Mew, MD  butalbital-acetaminophen-caffeine (FIORICET, ESGIC) [phone] MG tablet Take 1 tablet by mouth every 6 (six) hours as needed for headache or migraine. Patient not taking: Reported on 06/23/2017 08/15/15   Vaughan Basta, MD  HYDROcodone-acetaminophen (NORCO) 7.5-325 MG tablet Take 1-2 tablets by mouth every 4 (four) hours as needed for moderate pain. Patient not taking: Reported on 06/23/2017 03/07/16   Pabon, Bea Graff F, MD  ondansetron (ZOFRAN ODT) 4 MG disintegrating tablet Take 1 tablet (4 mg total) by mouth every 8 (eight) hours as needed for nausea or vomiting. Patient not taking: Reported on 06/23/2017 03/25/16   Clayburn Pert, MD  pantoprazole (PROTONIX) 40 MG tablet Take 1 tablet (40 mg total) by mouth daily. Patient not taking: Reported on 06/23/2017 03/23/16   Caroleen Hamman F, MD    Allergies Orphenadrine and Ephedrine  Family History  Problem Relation Age of Onset  . Hypertension Mother   . Hypertension Other     Social History Social History  Substance Use Topics  . Smoking status: Never Smoker  . Smokeless tobacco: Never Used  . Alcohol use No    Review of Systems  Constitutional: No fever/chills Eyes: No visual changes. ENT: No sore throat. Cardiovascular: Denies chest pain. Respiratory: Denies shortness of breath. Gastrointestinal:   No nausea, no vomiting.  No diarrhea.  No constipation.  Genitourinary: Negative for dysuria. Musculoskeletal: Negative for back pain. Skin: Negative for rash. Neurological: Negative for headaches, focal weakness or numbness.   ____________________________________________   PHYSICAL EXAM:  VITAL SIGNS: ED Triage Vitals [06/23/17 1111]  Enc Vitals Group      BP (!) 170/116     Pulse Rate 96     Resp 20     Temp 98.1 F (36.7 C)     Temp Source Oral     SpO2 98 %     Weight 298 lb (135.2 kg)     Height 5\' 6"  (1.676 m)     Head Circumference      Peak Flow      Pain Score 4     Pain Loc      Pain Edu?      Excl. in [place]?     Constitutional: Alert and oriented. Well appearing and in no acute distress. Eyes: Conjunctivae are normal.  Head: Atraumatic. Nose: No congestion/rhinnorhea. Mouth/Throat: Mucous membranes are moist.  Neck: No stridor.   Cardiovascular: Normal rate, regular rhythm. Grossly normal heart sounds.   Respiratory: Normal respiratory effort.  No retractions. Lungs CTAB. Gastrointestinal: Soft With minimal tenderness across the lower abdomen. No distention. Musculoskeletal: No lower extremity tenderness nor edema.  No joint effusions. Neurologic:  Normal speech and language. No gross focal neurologic deficits are appreciated. Skin:  Skin is warm, dry and intact. No rash noted. Psychiatric: Mood and affect are normal. Speech and behavior are normal.  ____________________________________________   LABS (all labs ordered are listed, but only abnormal results are displayed)  Labs Reviewed  CBC - Abnormal; Notable for the following:       Result Value   WBC 11.7 (*)    RDW 14.9 (*)    All other components within normal limits  BASIC METABOLIC PANEL - Abnormal; Notable for the following:    Potassium 3.3 (*)    Glucose, Bld 122 (*)    All other components within normal limits  URINALYSIS, COMPLETE (UACMP) WITH MICROSCOPIC - Abnormal; Notable for the following:    Color, Urine YELLOW (*)    APPearance HAZY (*)    Hgb urine dipstick MODERATE (*)    Protein, ur 30 (*)    Leukocytes, UA TRACE (*)    Squamous Epithelial / LPF 6-30 (*)    All other components within normal limits  HCG, QUANTITATIVE, PREGNANCY  POCT PREGNANCY, URINE  POC URINE PREG, ED    ____________________________________________  EKG   ____________________________________________  RADIOLOGY  5.2cm posterior uterine fibroid displacing the overlying endometrium. IUD appears to be in the upper portion of the uterus. ____________________________________________   PROCEDURES  Procedure(s) performed:   Procedures  Critical Care performed:   ____________________________________________   INITIAL IMPRESSION / ASSESSMENT AND PLAN / ED COURSE  Pertinent labs & imaging results that were available during my care of the patient were reviewed by me and considered in my medical decision making (see chart for details).  ----------------------------------------- 1:58 PM on 06/23/2017 -----------------------------------------  I discussed case with Dr. Leafy Ro who says that the patient likely will need a hysteroscopic to have the IUD removed and pus with a fibroid as well. I discussed the radiology results with the patient as well as the constipation I have Dr. Leafy Ro. The patient says that she will try her known OB/GYN at [facility] but would also like Dr. Migdalia Dk number because she would like to go to the first available. Her hemoglobin was normal today. She does  not appear to be losing a significant amount of blood despite her several months of bleeding. She is understanding of the plan with bipolar will be discharged home.      ____________________________________________   FINAL CLINICAL IMPRESSION(S) / ED DIAGNOSES  Final diagnoses:  Pelvic pain  Pelvic pain  Dysfunctional uterine bleeding.    NEW MEDICATIONS STARTED DURING THIS VISIT:  New Prescriptions   No medications on file     Note:  This document was prepared using Dragon voice recognition software and may include unintentional dictation errors.     Orbie Pyo, MD 06/23/17 [phone]

## 2017-07-29 ENCOUNTER — Emergency Department: Payer: Self-pay

## 2017-07-29 ENCOUNTER — Encounter: Payer: Self-pay | Admitting: Emergency Medicine

## 2017-07-29 ENCOUNTER — Emergency Department
Admission: EM | Admit: 2017-07-29 | Discharge: 2017-07-29 | Disposition: A | Discharge status: Home / Self Care | Payer: Self-pay | Attending: Emergency Medicine | Admitting: Emergency Medicine

## 2017-07-29 DIAGNOSIS — N939 Abnormal uterine and vaginal bleeding, unspecified: Secondary | ICD-10-CM | POA: Insufficient documentation

## 2017-07-29 DIAGNOSIS — I10 Essential (primary) hypertension: Secondary | ICD-10-CM | POA: Insufficient documentation

## 2017-07-29 DIAGNOSIS — T8332XD Displacement of intrauterine contraceptive device, subsequent encounter: Secondary | ICD-10-CM | POA: Insufficient documentation

## 2017-07-29 DIAGNOSIS — Y762 Prosthetic and other implants, materials and accessory obstetric and gynecological devices associated with adverse incidents: Secondary | ICD-10-CM | POA: Insufficient documentation

## 2017-07-29 LAB — CBC WITH DIFFERENTIAL/PLATELET
BASOS ABS: 0.1 10*3/uL (ref 0–0.1)
BASOS PCT: 1 %
EOS ABS: 0.3 10*3/uL (ref 0–0.7)
Eosinophils Relative: 3 %
HCT: 40 % (ref 35.0–47.0)
HEMOGLOBIN: 13.4 g/dL (ref 12.0–16.0)
Lymphocytes Relative: 23 %
Lymphs Abs: 3 10*3/uL (ref 1.0–3.6)
MCH: 28.2 pg (ref 26.0–34.0)
MCHC: 33.5 g/dL (ref 32.0–36.0)
MCV: 84.3 fL (ref 80.0–100.0)
MONOS PCT: 6 %
Monocytes Absolute: 0.8 10*3/uL (ref 0.2–0.9)
NEUTROS PCT: 67 %
Neutro Abs: 9 10*3/uL — ABNORMAL HIGH (ref 1.4–6.5)
Platelets: 355 10*3/uL (ref 150–440)
RBC: 4.75 MIL/uL (ref 3.80–5.20)
RDW: 15.5 % — ABNORMAL HIGH (ref 11.5–14.5)
WBC: 13.2 10*3/uL — AB (ref 3.6–11.0)

## 2017-07-29 LAB — POCT PREGNANCY, URINE: Preg Test, Ur: NEGATIVE

## 2017-07-29 LAB — BASIC METABOLIC PANEL
ANION GAP: 8 (ref 5–15)
BUN: 14 mg/dL (ref 6–20)
CALCIUM: 9.2 mg/dL (ref 8.9–10.3)
CO2: 26 mmol/L (ref 22–32)
Chloride: 104 mmol/L (ref 101–111)
Creatinine, Ser: 0.88 mg/dL (ref 0.44–1.00)
GFR calc Af Amer: >60 mL/min (ref >60)
Glucose, Bld: 97 mg/dL (ref 65–99)
POTASSIUM: 3.7 mmol/L (ref 3.5–5.1)
Sodium: 138 mmol/L (ref 135–145)

## 2017-07-29 LAB — PROTIME-INR
INR: 0.93
PROTHROMBIN TIME: 12.4 s (ref 11.4–15.2)

## 2017-07-29 LAB — HCG, QUANTITATIVE, PREGNANCY: hCG, Beta Chain, Quant, S: <1 m[IU]/mL (ref <5)

## 2017-07-29 LAB — TSH: TSH: 2.249 u[IU]/mL (ref 0.350–4.500)

## 2017-07-29 MED ORDER — IBUPROFEN 600 MG PO TABS: 600.0000 mg | ORAL_TABLET | Freq: Three times a day (TID) | ORAL | 0 refills | Status: DC | PRN

## 2017-07-29 NOTE — ED Provider Notes (Signed)
[facility] Emergency Department Provider Note  ____________________________________________   First MD Initiated Contact with Patient 07/29/17 1815     (approximate)  I have reviewed the triage vital signs and the nursing notes.   HISTORY  Chief Complaint Vaginal Bleeding   HPI Maria Dean is a 36 y.o. female who self presents to the emergency department with a large amount of vaginal bleeding and vaginal blood clots that began today. Several years ago she had a Mirena intrauterine device placed however last time her OB gynecologist examined her and they were unable to visualize the strings. She is scheduled to have the IUD removed by OB/GYN at [facility] in the near future. She's not had a period in several months and then today began having moderate severity lower abdominal cramping sensation and vaginal bleeding which prompted the visit today. The bleeding began suddenly and has been constant ever since. Nothing seems to make it better or worse.   Past Medical History:  Diagnosis Date  . Chest pain   . Hypertension     Patient Active Problem List   Diagnosis Date Noted  . Abdominal pain 03/06/2016  . Calculus of gallbladder without cholecystitis without obstruction   . Intracranial meningioma ([place]) 08/20/2015  . Chest pain, central 08/13/2015  . Bipolar affective disorder, currently depressed, mild ([place]) 05/14/2015  . Morbid (severe) obesity due to excess calories ([place]) 09/03/2014  . Cervical dysplasia, moderate 08/19/2014  . BP (high blood pressure) 07/16/2014  . Headache, migraine 07/16/2014  . Depression, postpartum 07/16/2014    Past Surgical History:  Procedure Laterality Date  . CESAREAN SECTION    . CHOLECYSTECTOMY N/A 03/06/2016   Procedure: LAPAROSCOPIC CHOLECYSTECTOMY, LIVER BIOPSY;  Surgeon: Jules Husbands, MD;  Location: [facility] ORS;  Service: General;  Laterality: N/A;  . LEEP      Prior to Admission medications     Medication Sig Start Date End Date Taking? Authorizing Provider  aluminum-magnesium hydroxide-simethicone (MAALOX) [phone] MG/5ML SUSP Take 30 mLs by mouth 4 (four) times daily -  before meals and at bedtime. Patient not taking: Reported on 06/23/2017 03/09/16   Carrie Mew, MD  butalbital-acetaminophen-caffeine (FIORICET, ESGIC) [phone] MG tablet Take 1 tablet by mouth every 6 (six) hours as needed for headache or migraine. Patient not taking: Reported on 06/23/2017 08/15/15   Vaughan Basta, MD  HYDROcodone-acetaminophen (NORCO) 7.5-325 MG tablet Take 1-2 tablets by mouth every 4 (four) hours as needed for moderate pain. Patient not taking: Reported on 06/23/2017 03/07/16   Caroleen Hamman F, MD  ibuprofen (ADVIL,MOTRIN) 600 MG tablet Take 1 tablet (600 mg total) by mouth every 8 (eight) hours as needed. 07/29/17   Darel Hong, MD  ondansetron (ZOFRAN ODT) 4 MG disintegrating tablet Take 1 tablet (4 mg total) by mouth every 8 (eight) hours as needed for nausea or vomiting. Patient not taking: Reported on 06/23/2017 03/25/16   Clayburn Pert, MD  pantoprazole (PROTONIX) 40 MG tablet Take 1 tablet (40 mg total) by mouth daily. Patient not taking: Reported on 06/23/2017 03/23/16   Caroleen Hamman F, MD    Allergies Orphenadrine and Ephedrine  Family History  Problem Relation Age of Onset  . Hypertension Mother   . Hypertension Other     Social History Social History  Substance Use Topics  . Smoking status: Never Smoker  . Smokeless tobacco: Never Used  . Alcohol use No    Review of Systems Constitutional: No fever/chills ENT: No sore throat. Cardiovascular: Denies chest pain.  Respiratory: Denies shortness of breath. Gastrointestinal: positive for abdominal pain.  No nausea, no vomiting.  No diarrhea.  No constipation. Musculoskeletal: Negative for back pain. Neurological: Negative for headaches   ____________________________________________   PHYSICAL  EXAM:  VITAL SIGNS: ED Triage Vitals  Enc Vitals Group     BP 07/29/17 1658 (!) 184/116     Pulse Rate 07/29/17 1658 92     Resp 07/29/17 1658 16     Temp 07/29/17 1658 98.8 F (37.1 C)     Temp Source 07/29/17 1658 Oral     SpO2 07/29/17 1658 100 %     Weight 07/29/17 1659 298 lb (135.2 kg)     Height 07/29/17 1659 5\' 5"  (1.651 m)     Head Circumference --      Peak Flow --      Pain Score --      Pain Loc --      Pain Edu? --      Excl. in [place]? --     Constitutional: alert and oriented 4 pleasant cooperative speaks in full clear sentences no diaphoresis Head: Atraumatic. Nose: No congestion/rhinnorhea. Mouth/Throat: No trismus Neck: No stridor.   Cardiovascular: regular rhythm Respiratory: Normal respiratory effort.  No retractions. Gastrointestinal: obese soft nontender pelvic exam chaperoned by female nurse,: Normal external exam os visualizes no IUD strings visualized through the os scant dried blood and no active bleeding no lacerations Neurologic:  Normal speech and language. No gross focal neurologic deficits are appreciated.  Skin:  Skin is warm, dry and intact. No rash noted.    ____________________________________________  LABS (all labs ordered are listed, but only abnormal results are displayed)  Labs Reviewed  CBC WITH DIFFERENTIAL/PLATELET - Abnormal; Notable for the following:       Result Value   WBC 13.2 (*)    RDW 15.5 (*)    Neutro Abs 9.0 (*)    All other components within normal limits  BASIC METABOLIC PANEL  HCG, QUANTITATIVE, PREGNANCY  PROTIME-INR  TSH  POCT PREGNANCY, URINE    blood work reviewed and interpreted by me no signs of anemia not pertinent __________________________________________  EKG   ____________________________________________  RADIOLOGY  abdominal x-ray reviewed by me shows IUD in the pelvis ____________________________________________   PROCEDURES  Procedure(s) performed: no  Procedures  Critical  Care performed: no  Observation: no ____________________________________________   INITIAL IMPRESSION / ASSESSMENT AND PLAN / ED COURSE  Pertinent labs & imaging results that were available during my care of the patient were reviewed by me and considered in my medical decision making (see chart for details).  The patient is well-appearing hemodynamically stable and not tachycardic. Her hemoglobin is stable and on exam she's not actively bleeding. I am unable to visualize the IUD strings however she already has follow-up with [facility] gynecology. If given her reassurance and NSAIDs and strict return precautions. The patient verbalizes understanding and agreement with plan.      ____________________________________________   FINAL CLINICAL IMPRESSION(S) / ED DIAGNOSES  Final diagnoses:  Vaginal bleeding  Displacement of intrauterine contraceptive device, subsequent encounter      NEW MEDICATIONS STARTED DURING THIS VISIT:  Discharge Medication List as of 07/29/2017  8:24 PM    START taking these medications   Details  ibuprofen (ADVIL,MOTRIN) 600 MG tablet Take 1 tablet (600 mg total) by mouth every 8 (eight) hours as needed., Starting Fri 07/29/2017, Print         Note:  This document was prepared using  [employer] forces training and education officer and may include unintentional dictation errors.      Darel Hong, MD 07/30/17 [id number]

## 2017-07-29 NOTE — Discharge Instructions (Signed)
Please make an appointment to follow-up with [facility] gynecology this coming week for reevaluation. Return to the emergency department sooner for any new or worsening symptoms such as worsening pain, worsening bleeding, or for any other concerns.  It was a pleasure to take care of you today, and thank you for coming to our emergency department.  If you have any questions or concerns before leaving please ask the nurse to grab me and I'm more than happy to go through your aftercare instructions again.  If you were prescribed any opioid pain medication today such as Norco, Vicodin, Percocet, morphine, hydrocodone, or oxycodone please make sure you do not drive when you are taking this medication as it can alter your ability to drive safely.  If you have any concerns once you are home that you are not improving or are in fact getting worse before you can make it to your follow-up appointment, please do not hesitate to call 911 and come back for further evaluation.  Darel Hong, MD  Results for orders placed or performed during the hospital encounter of 22/02/54  Basic metabolic panel  Result Value Ref Range   Sodium 138 135 - 145 mmol/L   Potassium 3.7 3.5 - 5.1 mmol/L   Chloride 104 101 - 111 mmol/L   CO2 26 22 - 32 mmol/L   Glucose, Bld 97 65 - 99 mg/dL   BUN 14 6 - 20 mg/dL   Creatinine, Ser 0.88 0.44 - 1.00 mg/dL   Calcium 9.2 8.9 - 10.3 mg/dL   GFR calc non Af Amer >60 >60 mL/min   GFR calc Af Amer >60 >60 mL/min   Anion gap 8 5 - 15  hCG, quantitative, pregnancy  Result Value Ref Range   hCG, Beta Chain, Quant, S <1 <5 mIU/mL  CBC with Differential  Result Value Ref Range   WBC 13.2 (H) 3.6 - 11.0 K/uL   RBC 4.75 3.80 - 5.20 MIL/uL   Hemoglobin 13.4 12.0 - 16.0 g/dL   HCT 40.0 35.0 - 47.0 %   MCV 84.3 80.0 - 100.0 fL   MCH 28.2 26.0 - 34.0 pg   MCHC 33.5 32.0 - 36.0 g/dL   RDW 15.5 (H) 11.5 - 14.5 %   Platelets 355 150 - 440 K/uL   Neutrophils Relative % 67 %   Neutro Abs 9.0  (H) 1.4 - 6.5 K/uL   Lymphocytes Relative 23 %   Lymphs Abs 3.0 1.0 - 3.6 K/uL   Monocytes Relative 6 %   Monocytes Absolute 0.8 0.2 - 0.9 K/uL   Eosinophils Relative 3 %   Eosinophils Absolute 0.3 0 - 0.7 K/uL   Basophils Relative 1 %   Basophils Absolute 0.1 0 - 0.1 K/uL  Protime-INR  Result Value Ref Range   Prothrombin Time 12.4 11.4 - 15.2 seconds   INR 0.93   TSH  Result Value Ref Range   TSH 2.249 0.350 - 4.500 uIU/mL  Pregnancy, urine POC  Result Value Ref Range   Preg Test, Ur NEGATIVE NEGATIVE   Dg Abdomen 1 View  Result Date: 07/29/2017 CLINICAL DATA:  Missing IUD EXAM: ABDOMEN - 1 VIEW COMPARISON:  Pelvic ultrasound 06/23/2017 FINDINGS: Surgical clips in the right upper quadrant. Nonobstructed bowel gas pattern. Calcified pelvic phleboliths. Intrauterine device is visible within the central pelvis. IMPRESSION: Intrauterine device visible in the central pelvis. Nonobstructed bowel-gas pattern Electronically Signed   By: Donavan Foil M.D.   On: 07/29/2017 19:45

## 2017-07-29 NOTE — ED Triage Notes (Signed)
Pt to ED via POV stating that she has had heavy vaginal bleeding since 1100 today. Pt states that she has been through 3 pads and 6 tampons since 1100. Pt states that she has an IUD but that "it is not where it is supposed to be". Pt states that OBGYN has been unable to located the strings for her IUD

## 2017-07-29 NOTE — ED Triage Notes (Signed)
First Nurse Note:  C/O heavy vaginal bleeding, onset of symptoms today at 1100.  Patient is AAOx3.  Skin warm and dry. Ambulates with easy and steady gait.  Posture upright and relaxed.  NAD

## 2017-12-05 ENCOUNTER — Other Ambulatory Visit: Payer: Self-pay

## 2017-12-05 ENCOUNTER — Emergency Department
Admission: EM | Admit: 2017-12-05 | Discharge: 2017-12-05 | Disposition: A | Discharge status: Home / Self Care | Payer: Medicaid Other | Attending: Emergency Medicine | Admitting: Emergency Medicine

## 2017-12-05 ENCOUNTER — Emergency Department: Payer: Medicaid Other

## 2017-12-05 ENCOUNTER — Encounter: Payer: Self-pay | Admitting: Emergency Medicine

## 2017-12-05 DIAGNOSIS — D259 Leiomyoma of uterus, unspecified: Secondary | ICD-10-CM | POA: Insufficient documentation

## 2017-12-05 DIAGNOSIS — N939 Abnormal uterine and vaginal bleeding, unspecified: Secondary | ICD-10-CM

## 2017-12-05 DIAGNOSIS — R102 Pelvic and perineal pain: Secondary | ICD-10-CM

## 2017-12-05 DIAGNOSIS — I10 Essential (primary) hypertension: Secondary | ICD-10-CM | POA: Insufficient documentation

## 2017-12-05 LAB — CBC WITH DIFFERENTIAL/PLATELET
Basophils Absolute: 0.1 10*3/uL (ref 0–0.1)
Basophils Relative: 1 %
EOS ABS: 0.3 10*3/uL (ref 0–0.7)
EOS PCT: 2 %
HCT: 36.5 % (ref 35.0–47.0)
Hemoglobin: 12.1 g/dL (ref 12.0–16.0)
LYMPHS PCT: 29 %
Lymphs Abs: 3.8 10*3/uL — ABNORMAL HIGH (ref 1.0–3.6)
MCH: 26.9 pg (ref 26.0–34.0)
MCHC: 33.2 g/dL (ref 32.0–36.0)
MCV: 81.1 fL (ref 80.0–100.0)
MONO ABS: 1 10*3/uL — AB (ref 0.2–0.9)
Monocytes Relative: 8 %
Neutro Abs: 7.7 10*3/uL — ABNORMAL HIGH (ref 1.4–6.5)
Neutrophils Relative %: 60 %
PLATELETS: 345 10*3/uL (ref 150–440)
RBC: 4.5 MIL/uL (ref 3.80–5.20)
RDW: 15.7 % — AB (ref 11.5–14.5)
WBC: 12.9 10*3/uL — AB (ref 3.6–11.0)

## 2017-12-05 LAB — COMPREHENSIVE METABOLIC PANEL
ALT: 14 U/L (ref 14–54)
ANION GAP: 9 (ref 5–15)
AST: 14 U/L — ABNORMAL LOW (ref 15–41)
Albumin: 3.8 g/dL (ref 3.5–5.0)
Alkaline Phosphatase: 84 U/L (ref 38–126)
BUN: 11 mg/dL (ref 6–20)
CHLORIDE: 102 mmol/L (ref 101–111)
CO2: 26 mmol/L (ref 22–32)
Calcium: 8.9 mg/dL (ref 8.9–10.3)
Creatinine, Ser: 0.76 mg/dL (ref 0.44–1.00)
GFR calc non Af Amer: >60 mL/min (ref >60)
Glucose, Bld: 103 mg/dL — ABNORMAL HIGH (ref 65–99)
POTASSIUM: 3.5 mmol/L (ref 3.5–5.1)
SODIUM: 137 mmol/L (ref 135–145)
Total Bilirubin: 0.9 mg/dL (ref 0.3–1.2)
Total Protein: 8.3 g/dL — ABNORMAL HIGH (ref 6.5–8.1)

## 2017-12-05 LAB — URINALYSIS, COMPLETE (UACMP) WITH MICROSCOPIC: SPECIFIC GRAVITY, URINE: 1.02 (ref 1.005–1.030)

## 2017-12-05 LAB — POCT PREGNANCY, URINE: PREG TEST UR: NEGATIVE

## 2017-12-05 MED ORDER — IBUPROFEN 800 MG PO TABS: 800.0000 mg | ORAL_TABLET | Freq: Three times a day (TID) | ORAL | 0 refills | Status: DC | PRN

## 2017-12-05 MED ORDER — OXYCODONE-ACETAMINOPHEN 5-325 MG PO TABS: 2.0000 | ORAL_TABLET | Freq: Four times a day (QID) | ORAL | 0 refills | Status: DC | PRN

## 2017-12-05 NOTE — ED Provider Notes (Signed)
[facility] Emergency Department Provider Note       Time seen: ----------------------------------------- 8:45 PM on 12/05/2017 -----------------------------------------   I have reviewed the triage vital signs and the nursing notes.  HISTORY   Chief Complaint Vaginal Bleeding    HPI GULIANA WEYANDT is a 37 y.o. female with a history of chest pain, bipolar disorder, migraine headaches, and hypertension who presents to the ED for vaginal bleeding for 3 days.  Patient states she normally has no periods due to the IUD placement.  She states she is passing clots.  She was told her IUD was misplaced according to an ultrasound that was performed 6 months ago.  She is having some abdominal pain that radiates down the right leg.  She has not had vaginal bleeding in the last 6 months.  Past Medical History:  Diagnosis Date  . Chest pain   . Hypertension     Patient Active Problem List   Diagnosis Date Noted  . Abdominal pain 03/06/2016  . Calculus of gallbladder without cholecystitis without obstruction   . Intracranial meningioma ([place]) 08/20/2015  . Chest pain, central 08/13/2015  . Bipolar affective disorder, currently depressed, mild ([place]) 05/14/2015  . Morbid (severe) obesity due to excess calories ([place]) 09/03/2014  . Cervical dysplasia, moderate 08/19/2014  . BP (high blood pressure) 07/16/2014  . Headache, migraine 07/16/2014  . Depression, postpartum 07/16/2014    Past Surgical History:  Procedure Laterality Date  . CESAREAN SECTION    . CHOLECYSTECTOMY N/A 03/06/2016   Procedure: LAPAROSCOPIC CHOLECYSTECTOMY, LIVER BIOPSY;  Surgeon: Jules Husbands, MD;  Location: [facility] ORS;  Service: General;  Laterality: N/A;  . LEEP      Allergies Orphenadrine and Ephedrine  Social History Social History   Tobacco Use  . Smoking status: Never Smoker  . Smokeless tobacco: Never Used  Substance Use Topics  . Alcohol use: No  . Drug use: No     Review of Systems Constitutional: Negative for fever. Cardiovascular: Negative for chest pain. Respiratory: Negative for shortness of breath. Gastrointestinal: Positive right sided pelvic pain Genitourinary: Negative for dysuria. positive for vaginal bleeding Musculoskeletal: Negative for back pain. Skin: Negative for rash. Neurological: Negative for headaches, focal weakness or numbness.  All systems negative/normal/unremarkable except as stated in the HPI  ____________________________________________   PHYSICAL EXAM:  VITAL SIGNS: ED Triage Vitals [12/05/17 2032]  Enc Vitals Group     BP (!) 177/105     Pulse Rate 87     Resp 20     Temp 98.1 F (36.7 C)     Temp Source Oral     SpO2 99 %     Weight 298 lb (135.2 kg)     Height 5\' 6"  (1.676 m)     Head Circumference      Peak Flow      Pain Score 7     Pain Loc      Pain Edu?      Excl. in [place]?     Constitutional: Alert and oriented. Well appearing and in no distress.  Obese Eyes: Conjunctivae are normal. Normal extraocular movements. ENT   Head: Normocephalic and atraumatic.   Nose: No congestion/rhinnorhea.   Mouth/Throat: Mucous membranes are moist.   Neck: No stridor. Cardiovascular: Normal rate, regular rhythm. No murmurs, rubs, or gallops. Respiratory: Normal respiratory effort without tachypnea nor retractions. Breath sounds are clear and equal bilaterally. No wheezes/rales/rhonchi. Gastrointestinal: Right lower quadrant and right hemipelvic tenderness.  No rebound  or guarding.  Normal bowel sounds. Musculoskeletal: Nontender with normal range of motion in extremities. No lower extremity tenderness nor edema. Neurologic:  Normal speech and language. No gross focal neurologic deficits are appreciated.  Skin:  Skin is warm, dry and intact. No rash noted. Psychiatric: Mood and affect are normal. Speech and behavior are normal.  ____________________________________________  ED COURSE:  As  part of my medical decision making, I reviewed the following data within the [place] History obtained from family if available, nursing notes, old chart and ekg, as well as notes from prior ED visits. Patient presented for vaginal bleeding, we will assess with labs and imaging as indicated at this time.   Procedures ____________________________________________   LABS (pertinent positives/negatives)  Labs Reviewed  CBC WITH DIFFERENTIAL/PLATELET - Abnormal; Notable for the following components:      Result Value   WBC 12.9 (*)    RDW 15.7 (*)    Neutro Abs 7.7 (*)    Lymphs Abs 3.8 (*)    Monocytes Absolute 1.0 (*)    All other components within normal limits  COMPREHENSIVE METABOLIC PANEL - Abnormal; Notable for the following components:   Glucose, Bld 103 (*)    Total Protein 8.3 (*)    AST 14 (*)    All other components within normal limits  URINALYSIS, COMPLETE (UACMP) WITH MICROSCOPIC - Abnormal; Notable for the following components:   Color, Urine RED (*)    APPearance CLOUDY (*)    Glucose, UA   (*)    Value: TEST NOT REPORTED DUE TO COLOR INTERFERENCE OF URINE PIGMENT   Hgb urine dipstick   (*)    Value: TEST NOT REPORTED DUE TO COLOR INTERFERENCE OF URINE PIGMENT   Bilirubin Urine   (*)    Value: TEST NOT REPORTED DUE TO COLOR INTERFERENCE OF URINE PIGMENT   Ketones, ur   (*)    Value: TEST NOT REPORTED DUE TO COLOR INTERFERENCE OF URINE PIGMENT   Protein, ur   (*)    Value: TEST NOT REPORTED DUE TO COLOR INTERFERENCE OF URINE PIGMENT   Nitrite   (*)    Value: TEST NOT REPORTED DUE TO COLOR INTERFERENCE OF URINE PIGMENT   Leukocytes, UA   (*)    Value: TEST NOT REPORTED DUE TO COLOR INTERFERENCE OF URINE PIGMENT   All other components within normal limits  POCT PREGNANCY, URINE    RADIOLOGY Images were viewed by me  Pelvic ultrasound  IMPRESSION: 1. Large uterine fibroid measuring up to 8.1 cm at the uterine fundus. 2. Beam attenuation  artifacts from the above-described fibroid, bowel gas and patient body habitus limited assessment of the endometrium and adnexa. Intrauterine contraceptive device is within the endometrial cavity, but precise positioning is indeterminate. The inferior tip of the device may be at the lower uterine segment. ____________________________________________  DIFFERENTIAL DIAGNOSIS   Abnormal vaginal bleeding, IUD displacement, pregnancy, ectopic pregnancy, ovarian cyst, ovarian torsion  FINAL ASSESSMENT AND PLAN  Abnormal vaginal bleeding   Plan: Patient had presented for abnormal vaginal bleeding. Patient's labs were grossly unremarkable. Patient's imaging revealed a large uterine fibroid but otherwise no acute process.  I discussed with GYN on call Dr. Georgianne Fick who states he will follow-up with her in clinic.  Additional hormones are not recommended at this time.  She will be discharged with pain medicine.   Laurence Aly, MD   Note: This note was generated in part or whole with voice recognition software. Voice  recognition is usually quite accurate but there are transcription errors that can and very often do occur. I apologize for any typographical errors that were not detected and corrected.     Earleen Newport, MD 12/05/17 2217

## 2017-12-05 NOTE — ED Triage Notes (Addendum)
Patient ambulatory to triage with steady gait, without difficulty or distress noted; pt reports vag bleeding x 3 days; st normally has no periods due to IUD placement; st passing blood clots; st was told her IUD was misplaced per u/s perf 57mos ago but "haven't decided what to do"; c/o right lower abd pain radiating down right leg; upon review of care everywhere, GYN notes from 9/18 indicate u/s revealed 5cm fibroid and IUD in correct position but due to inability to feel strings, would be surgical intervention to remove per pt request for removal

## 2017-12-05 NOTE — ED Notes (Signed)
Pt back from US

## 2017-12-05 NOTE — ED Notes (Signed)
ED Provider at bedside. 

## 2018-01-11 NOTE — H&P (Signed)
Chief Complaint:    Maria Dean is a 37 y.o. female here for LAparoscopic supracervical hysterectomy and bilateral salpingectomy  Rf by Dr Salome Holmes [place] [facility]-uterine fibroids .pt with a 6 month h/o of heavy vaginal bleeding last 15-20 days per month . Large clot passage. No dyspareunia , + dysmenorrhea . + fatigue .  G4P4 svd x3 and last one c/s .  Pelvic u/s show a 12 cm UTX and 8x6x6 cm uterine fibroid .  office EMBX : shows chronic endometritis  Pap 05/2017 : neg Past Medical History:  has a past medical history of Frequent headaches, History of trichomoniasis, Hyperemesis, Hypertension (04/2014), Intracranial meningioma (CMS-HCC) (08/20/2015), and Morbid obesity (CMS-HCC).  Past Surgical History:  has a past surgical history that includes cesarean delivery (N/A, 06/11/2014); conization cervix by loop electrode exc (N/A, 09/06/2014); and Cholecystectomy (04/2016). Family History: family history includes Anemia in her sister; Bipolar disorder in her paternal grandmother; Diabetes in her maternal grandmother; Diabetes type II in her maternal grandmother and paternal grandmother; High blood pressure (Hypertension) in her father. Social History:  reports that she has never smoked. She has never used smokeless tobacco. She reports that she drinks alcohol. She reports that she does not use drugs. OB/GYN History:          OB History    Gravida  4   Para  4   Term  3   Preterm  1   AB  0   Living  4     SAB      TAB      Ectopic      Molar      Multiple  0   Live Births  4          Allergies: is allergic to orphenadrine and ephedrine. Medications:  Current Outpatient Medications:  .  amLODIPine (NORVASC) 10 MG tablet, Take 1 tablet (10 mg total) by mouth once daily., Disp: 30 tablet, Rfl: 11 .  fluticasone (FLONASE) 50 mcg/actuation nasal spray, INHALE 2 SPRAY BY INTRANASAL ROUTE EVERY DAY IN EACH NOSTRIL, Disp: , Rfl: 0 .  metoprolol tartrate (LOPRESSOR)  25 MG tablet, Take 1 tablet (25 mg total) by mouth 2 (two) times daily, Disp: 60 tablet, Rfl: 11  Review of Systems: General:                      No fatigue or weight loss, + weight gain  Eyes:                           No vision changes Ears:                            No hearing difficulty Respiratory:                No cough or shortness of breath Pulmonary:                  No asthma or shortness of breath Cardiovascular:           No chest pain, palpitations, dyspnea on exertion Gastrointestinal:          No abdominal bloating, chronic diarrhea, constipations, masses, pain or hematochezia Genitourinary:             No hematuria, dysuria, abnormal vaginal discharge, pelvic pain, Menometrorrhagia Lymphatic:  No swollen lymph nodes Musculoskeletal:         No muscle weakness Neurologic:                  No extremity weakness, syncope, seizure disorder Psychiatric:                  No history of depression, delusions or suicidal/homicidal ideation    Exam:      Vitals:   01/03/18 0951  BP: (!) 152/100  Pulse: 84    Body mass index is 51.65 kg/m.  WDWN black female in NAD   Lungs: CTA  CV : RRR without murmur    Neck:  no thyromegaly Abdomen: soft , no mass, normal active bowel sounds,  non-tender, no rebound tenderness Pelvic: tanner stage 5 ,  External genitalia: vulva /labia no lesions Urethra: no prolapse Vagina: normal physiologic d/c, limited room for LAVH  Cervix: no lesions, no cervical motion tenderness   Uterus: normal size shape and contour, non-tender Adnexa: no mass,  non-tender   Rectovaginal:   Impression:   The primary encounter diagnosis was Menorrhagia with irregular cycle. A diagnosis of Intramural leiomyoma of uterus was also pertinent to this visit.    Plan:  I have spoken with the patient regarding treatment options including expectant management, hormonal options, or surgical intervention. After a full  discussion the pt elects to proceed with [facility] and bilateral salpingectomy  Benefits and risks to surgery:SHe is aware of the risk of using the morcellator and upstaging a leiomyosarcoma if found at the time of surgery   The proposed benefit of the surgery has been discussed with the patient. The possible risks include, but are not limited to: organ injury to the bowel , bladder, ureters, and major blood vessels and nerves. There is a possibility of additional surgeries resulting from these injuries. There is also the risk of blood transfusion and the need to receive blood products during or after the procedure which may rarely lead to HIV or Hepatitis C infection. There is a risk of developing a deep venous thrombosis or a pulmonary embolism . There is the possibility of wound infection and also anesthetic complications, even the rare possibility of death. The patient understands these risks and wishes to proceed. All questions have been answered and the consent has been signed.                          Return if symptoms worsen or fail to improve, for preop.  Caroline Sauger, MD

## 2018-01-16 ENCOUNTER — Encounter
Admission: RE | Admit: 2018-01-16 | Discharge: 2018-01-16 | Disposition: A | Discharge status: Home / Self Care | Payer: Self-pay | Source: Ambulatory Visit | Attending: Obstetrics and Gynecology | Admitting: Obstetrics and Gynecology

## 2018-01-16 ENCOUNTER — Other Ambulatory Visit: Payer: Self-pay

## 2018-01-16 DIAGNOSIS — R9431 Abnormal electrocardiogram [ECG] [EKG]: Secondary | ICD-10-CM | POA: Insufficient documentation

## 2018-01-16 DIAGNOSIS — Z01812 Encounter for preprocedural laboratory examination: Secondary | ICD-10-CM | POA: Insufficient documentation

## 2018-01-16 DIAGNOSIS — Z01810 Encounter for preprocedural cardiovascular examination: Secondary | ICD-10-CM | POA: Insufficient documentation

## 2018-01-16 DIAGNOSIS — I10 Essential (primary) hypertension: Secondary | ICD-10-CM | POA: Insufficient documentation

## 2018-01-16 HISTORY — DX: Anemia, unspecified: D64.9

## 2018-01-16 LAB — BASIC METABOLIC PANEL
Anion gap: 9 (ref 5–15)
BUN: 13 mg/dL (ref 6–20)
CO2: 25 mmol/L (ref 22–32)
CREATININE: 0.74 mg/dL (ref 0.44–1.00)
Calcium: 9.1 mg/dL (ref 8.9–10.3)
Chloride: 102 mmol/L (ref 101–111)
GFR calc Af Amer: >60 mL/min (ref >60)
Glucose, Bld: 75 mg/dL (ref 65–99)
Potassium: 4 mmol/L (ref 3.5–5.1)
Sodium: 136 mmol/L (ref 135–145)

## 2018-01-16 LAB — CBC
HCT: 34.3 % — ABNORMAL LOW (ref 35.0–47.0)
Hemoglobin: 10.9 g/dL — ABNORMAL LOW (ref 12.0–16.0)
MCH: 25 pg — ABNORMAL LOW (ref 26.0–34.0)
MCHC: 31.7 g/dL — AB (ref 32.0–36.0)
MCV: 78.9 fL — ABNORMAL LOW (ref 80.0–100.0)
PLATELETS: 472 10*3/uL — AB (ref 150–440)
RBC: 4.35 MIL/uL (ref 3.80–5.20)
RDW: 16.2 % — AB (ref 11.5–14.5)
WBC: 9.3 10*3/uL (ref 3.6–11.0)

## 2018-01-16 LAB — TYPE AND SCREEN
ABO/RH(D): A POS
ANTIBODY SCREEN: NEGATIVE

## 2018-01-16 NOTE — Patient Instructions (Signed)
Your procedure is scheduled on: Monday 01/23/18 Report to [facility]. To find out your arrival time please call [phone] between 1PM - 3PM on Friday 01/20/18.  Remember: Instructions that are not followed completely may result in serious medical risk, up to and including death, or upon the discretion of your surgeon and anesthesiologist your surgery may need to be rescheduled.     _X__ 1. Do not eat food after midnight the night before your procedure.                 No gum chewing or hard candies. You may drink clear liquids up to 2 hours                 before you are scheduled to arrive for your surgery- DO not drink clear                 liquids within 2 hours of the start of your surgery.                 Clear Liquids include:  water, apple juice without pulp, clear carbohydrate                 drink such as Clearfast or Gatorade, Black Coffee or Tea (Do not add                 anything to coffee or tea).  __X__2.  On the morning of surgery brush your teeth with toothpaste and water, you                 may rinse your mouth with mouthwash if you wish.  Do not swallow any              toothpaste of mouthwash.     _X__ 3.  No Alcohol for 24 hours before or after surgery.   _X__ 4.  Do Not Smoke or use e-cigarettes For 24 Hours Prior to Your Surgery.                 Do not use any chewable tobacco products for at least 6 hours prior to                 surgery.  ____  5.  Bring all medications with you on the day of surgery if instructed.   __X__  6.  Notify your doctor if there is any change in your medical condition      (cold, fever, infections).     Do not wear jewelry, make-up, hairpins, clips or nail polish. Do not wear lotions, powders, or perfumes.  Do not shave 48 hours prior to surgery. Men may shave face and neck. Do not bring valuables to the hospital.    [facility] is not responsible for any belongings or  valuables.  Contacts, dentures/partials or body piercings may not be worn into surgery. Bring a case for your contacts, glasses or hearing aids, a denture cup will be supplied. Leave your suitcase in the car. After surgery it may be brought to your room. For patients admitted to the hospital, discharge time is determined by your treatment team.   Patients discharged the day of surgery will not be allowed to drive home.   Please read over the following fact sheets that you were given:   MRSA Information  __X__ Take these medicines the morning of surgery with A SIP OF WATER:  1. METOPROLOL  2.   3.   4.  5.  6.  ____ Fleet Enema (as directed)   __X__ Use CHG Soap/SAGE wipes as directed  ____ Use inhalers on the day of surgery  ____ Stop metformin/Janumet/Farxiga 2 days prior to surgery    ____ Take 1/2 of usual insulin dose the night before surgery. No insulin the morning          of surgery.   ____ Stop Blood Thinners Coumadin/Plavix/Xarelto/Pleta/Pradaxa/Eliquis/Effient/Aspirin  on   Or contact your Surgeon, Cardiologist or Medical Doctor regarding  ability to stop your blood thinners  __X__ Stop Anti-inflammatories 7 days before surgery such as Advil, Ibuprofen, Motrin,  BC or Goodies Powder, Naprosyn, Naproxen, Aleve, Aspirin YOU MAY USE TYLENOL   __X__ Stop all herbal supplements, fish oil or vitamin E until after surgery.    ____ Bring C-Pap to the hospital.

## 2018-01-16 NOTE — Pre-Procedure Instructions (Signed)
EKG/ ELEVATED BP READINGS AT PREOP, , AS INSTRUCTED BY DR [place] FAXED TO DR Iona Beard. ALSO FAXED TO DR SCHERMERHORN'S. SPOKE WITH LESLIE AT [facility]. THEY WILL GET TO DR Iona Beard INFO AND IF NEED TO SEE PATIENT TODAY IN THEIR OFFICE. SPOKE WITH April AT DR Tonette Bihari

## 2018-01-17 NOTE — Pre-Procedure Instructions (Signed)
CBC results sent to Dr. Ouida Sills and Lurline Del for review.

## 2018-01-23 ENCOUNTER — Inpatient Hospital Stay
Admission: RE | Admit: 2018-01-23 | Discharge: 2018-01-24 | DRG: 743 | Disposition: A | Discharge status: Home / Self Care | Payer: Self-pay | Source: Ambulatory Visit | Attending: Obstetrics and Gynecology | Admitting: Obstetrics and Gynecology

## 2018-01-23 ENCOUNTER — Ambulatory Visit: Payer: Self-pay | Admitting: Anesthesiology

## 2018-01-23 ENCOUNTER — Other Ambulatory Visit: Payer: Self-pay

## 2018-01-23 ENCOUNTER — Encounter
Admission: RE | Disposition: A | Discharge status: Home / Self Care | Payer: Self-pay | Source: Ambulatory Visit | Attending: Obstetrics and Gynecology

## 2018-01-23 DIAGNOSIS — N920 Excessive and frequent menstruation with regular cycle: Principal | ICD-10-CM | POA: Diagnosis present

## 2018-01-23 DIAGNOSIS — N921 Excessive and frequent menstruation with irregular cycle: Secondary | ICD-10-CM

## 2018-01-23 DIAGNOSIS — Z9889 Other specified postprocedural states: Secondary | ICD-10-CM

## 2018-01-23 DIAGNOSIS — N711 Chronic inflammatory disease of uterus: Secondary | ICD-10-CM | POA: Diagnosis present

## 2018-01-23 DIAGNOSIS — N946 Dysmenorrhea, unspecified: Secondary | ICD-10-CM | POA: Diagnosis present

## 2018-01-23 DIAGNOSIS — D251 Intramural leiomyoma of uterus: Secondary | ICD-10-CM | POA: Diagnosis present

## 2018-01-23 HISTORY — PX: CYSTOSCOPY: SHX5120

## 2018-01-23 HISTORY — PX: LAPAROSCOPIC LYSIS OF ADHESIONS: SHX5905

## 2018-01-23 HISTORY — PX: TOTAL LAPAROSCOPIC HYSTERECTOMY WITH SALPINGECTOMY: SHX6742

## 2018-01-23 LAB — ABO/RH: ABO/RH(D): A POS

## 2018-01-23 LAB — POCT PREGNANCY, URINE: Preg Test, Ur: NEGATIVE

## 2018-01-23 SURGERY — CYSTOSCOPY
Anesthesia: General | Wound class: Clean Contaminated

## 2018-01-23 MED ORDER — BUPIVACAINE HCL (PF) 0.5 % IJ SOLN
INTRAMUSCULAR | Status: AC
  Filled 2018-01-23: qty 30

## 2018-01-23 MED ORDER — FAMOTIDINE 20 MG PO TABS
20.0000 mg | ORAL_TABLET | Freq: Once | ORAL | Status: AC
  Administered 2018-01-23: 20 mg via ORAL

## 2018-01-23 MED ORDER — ACETAMINOPHEN 10 MG/ML IV SOLN
INTRAVENOUS | Status: DC | PRN
  Administered 2018-01-23: 1000 mg via INTRAVENOUS

## 2018-01-23 MED ORDER — PROPOFOL 10 MG/ML IV BOLUS
INTRAVENOUS | Status: DC | PRN
  Administered 2018-01-23: 150 mg via INTRAVENOUS

## 2018-01-23 MED ORDER — KETOROLAC TROMETHAMINE 30 MG/ML IJ SOLN
30.0000 mg | Freq: Three times a day (TID) | INTRAMUSCULAR | Status: DC | PRN
  Administered 2018-01-23 – 2018-01-24 (×3): 30 mg via INTRAVENOUS
  Filled 2018-01-23: qty 1

## 2018-01-23 MED ORDER — KETOROLAC TROMETHAMINE 30 MG/ML IJ SOLN
INTRAMUSCULAR | Status: DC | PRN
  Administered 2018-01-23: 30 mg via INTRAVENOUS

## 2018-01-23 MED ORDER — KETOROLAC TROMETHAMINE 30 MG/ML IJ SOLN
30.0000 mg | Freq: Three times a day (TID) | INTRAMUSCULAR | Status: DC | PRN
  Filled 2018-01-23 (×2): qty 1

## 2018-01-23 MED ORDER — BUPIVACAINE HCL 0.5 % IJ SOLN
INTRAMUSCULAR | Status: DC | PRN
  Administered 2018-01-23: 23 mL

## 2018-01-23 MED ORDER — FENTANYL CITRATE (PF) 100 MCG/2ML IJ SOLN
INTRAMUSCULAR | Status: DC | PRN
  Administered 2018-01-23: 50 ug via INTRAVENOUS
  Administered 2018-01-23: 25 ug via INTRAVENOUS
  Administered 2018-01-23 (×3): 50 ug via INTRAVENOUS
  Administered 2018-01-23: 25 ug via INTRAVENOUS

## 2018-01-23 MED ORDER — PROPOFOL 10 MG/ML IV BOLUS
INTRAVENOUS | Status: AC
  Filled 2018-01-23: qty 20

## 2018-01-23 MED ORDER — ONDANSETRON HCL 4 MG PO TABS
4.0000 mg | ORAL_TABLET | Freq: Four times a day (QID) | ORAL | Status: DC | PRN
  Administered 2018-01-24: 4 mg via ORAL
  Filled 2018-01-23: qty 1

## 2018-01-23 MED ORDER — FLUORESCEIN SODIUM 10 % IV SOLN
INTRAVENOUS | Status: AC
  Filled 2018-01-23: qty 5

## 2018-01-23 MED ORDER — LACTATED RINGERS IV SOLN
INTRAVENOUS | Status: DC
  Administered 2018-01-23 – 2018-01-24 (×3): via INTRAVENOUS

## 2018-01-23 MED ORDER — ONDANSETRON HCL 4 MG/2ML IJ SOLN
INTRAMUSCULAR | Status: AC
  Filled 2018-01-23: qty 2

## 2018-01-23 MED ORDER — DEXAMETHASONE SODIUM PHOSPHATE 10 MG/ML IJ SOLN
INTRAMUSCULAR | Status: DC | PRN
  Administered 2018-01-23: 10 mg via INTRAVENOUS

## 2018-01-23 MED ORDER — MIDAZOLAM HCL 2 MG/2ML IJ SOLN
INTRAMUSCULAR | Status: AC
  Filled 2018-01-23: qty 2

## 2018-01-23 MED ORDER — ONDANSETRON HCL 4 MG/2ML IJ SOLN: 4.0000 mg | Freq: Once | INTRAMUSCULAR | Status: DC | PRN

## 2018-01-23 MED ORDER — MIDAZOLAM HCL 2 MG/2ML IJ SOLN
INTRAMUSCULAR | Status: DC | PRN
  Administered 2018-01-23: 2 mg via INTRAVENOUS

## 2018-01-23 MED ORDER — LACTATED RINGERS IV SOLN: INTRAVENOUS | Status: DC

## 2018-01-23 MED ORDER — ONDANSETRON HCL 4 MG/2ML IJ SOLN
INTRAMUSCULAR | Status: DC | PRN
  Administered 2018-01-23: 4 mg via INTRAVENOUS

## 2018-01-23 MED ORDER — OXYMETAZOLINE HCL 0.05 % NA SOLN
NASAL | Status: AC
  Filled 2018-01-23: qty 15

## 2018-01-23 MED ORDER — OXYCODONE HCL 5 MG/5ML PO SOLN: 5.0000 mg | Freq: Once | ORAL | Status: DC | PRN

## 2018-01-23 MED ORDER — HYDROMORPHONE HCL 1 MG/ML IJ SOLN
0.5000 mg | INTRAMUSCULAR | Status: DC | PRN
  Administered 2018-01-23 (×2): 0.5 mg via INTRAVENOUS

## 2018-01-23 MED ORDER — HYDROMORPHONE HCL 1 MG/ML IJ SOLN
INTRAMUSCULAR | Status: AC
  Administered 2018-01-23: 0.5 mg via INTRAVENOUS
  Filled 2018-01-23: qty 1

## 2018-01-23 MED ORDER — ACETAMINOPHEN 10 MG/ML IV SOLN
INTRAVENOUS | Status: AC
  Filled 2018-01-23: qty 100

## 2018-01-23 MED ORDER — FENTANYL CITRATE (PF) 100 MCG/2ML IJ SOLN
25.0000 ug | INTRAMUSCULAR | Status: DC | PRN
  Administered 2018-01-23: 50 ug via INTRAVENOUS
  Administered 2018-01-23 (×4): 25 ug via INTRAVENOUS

## 2018-01-23 MED ORDER — OXYCODONE-ACETAMINOPHEN 5-325 MG PO TABS
1.0000 | ORAL_TABLET | ORAL | Status: DC | PRN
  Administered 2018-01-24 (×2): 1 via ORAL
  Filled 2018-01-23 (×2): qty 1

## 2018-01-23 MED ORDER — SUGAMMADEX SODIUM 500 MG/5ML IV SOLN
INTRAVENOUS | Status: AC
  Filled 2018-01-23: qty 5

## 2018-01-23 MED ORDER — MEPERIDINE HCL 50 MG/ML IJ SOLN: 6.2500 mg | INTRAMUSCULAR | Status: DC | PRN

## 2018-01-23 MED ORDER — CEFAZOLIN SODIUM-DEXTROSE 2-4 GM/100ML-% IV SOLN
INTRAVENOUS | Status: AC
  Filled 2018-01-23: qty 100

## 2018-01-23 MED ORDER — LIDOCAINE HCL (PF) 2 % IJ SOLN
INTRAMUSCULAR | Status: AC
  Filled 2018-01-23: qty 10

## 2018-01-23 MED ORDER — LACTATED RINGERS IV SOLN
INTRAVENOUS | Status: DC
  Administered 2018-01-23 (×2): via INTRAVENOUS

## 2018-01-23 MED ORDER — SUCCINYLCHOLINE CHLORIDE 20 MG/ML IJ SOLN
INTRAMUSCULAR | Status: DC | PRN
  Administered 2018-01-23: 150 mg via INTRAVENOUS

## 2018-01-23 MED ORDER — FAMOTIDINE 20 MG PO TABS
ORAL_TABLET | ORAL | Status: AC
  Administered 2018-01-23: 20 mg via ORAL
  Filled 2018-01-23: qty 1

## 2018-01-23 MED ORDER — FENTANYL CITRATE (PF) 100 MCG/2ML IJ SOLN
INTRAMUSCULAR | Status: AC
Start: 2018-01-23 — End: 2018-01-23
  Administered 2018-01-23: 50 ug via INTRAVENOUS
  Filled 2018-01-23: qty 2

## 2018-01-23 MED ORDER — FENTANYL CITRATE (PF) 250 MCG/5ML IJ SOLN
INTRAMUSCULAR | Status: AC
  Filled 2018-01-23: qty 5

## 2018-01-23 MED ORDER — METOPROLOL TARTRATE 50 MG PO TABS
50.0000 mg | ORAL_TABLET | Freq: Two times a day (BID) | ORAL | Status: DC
  Administered 2018-01-23 – 2018-01-24 (×2): 50 mg via ORAL
  Filled 2018-01-23 (×4): qty 1

## 2018-01-23 MED ORDER — LIDOCAINE HCL (CARDIAC) 20 MG/ML IV SOLN
INTRAVENOUS | Status: DC | PRN
  Administered 2018-01-23: 100 mg via INTRAVENOUS

## 2018-01-23 MED ORDER — PHENYLEPHRINE HCL 10 MG/ML IJ SOLN
INTRAMUSCULAR | Status: DC | PRN
  Administered 2018-01-23: 100 ug via INTRAVENOUS

## 2018-01-23 MED ORDER — SUGAMMADEX SODIUM 500 MG/5ML IV SOLN
INTRAVENOUS | Status: DC | PRN
  Administered 2018-01-23: 300 mg via INTRAVENOUS

## 2018-01-23 MED ORDER — ROCURONIUM BROMIDE 100 MG/10ML IV SOLN
INTRAVENOUS | Status: DC | PRN
  Administered 2018-01-23: 5 mg via INTRAVENOUS
  Administered 2018-01-23: 10 mg via INTRAVENOUS
  Administered 2018-01-23: 35 mg via INTRAVENOUS

## 2018-01-23 MED ORDER — ROCURONIUM BROMIDE 50 MG/5ML IV SOLN
INTRAVENOUS | Status: AC
  Filled 2018-01-23: qty 1

## 2018-01-23 MED ORDER — FENTANYL CITRATE (PF) 100 MCG/2ML IJ SOLN
INTRAMUSCULAR | Status: AC
  Administered 2018-01-23: 25 ug via INTRAVENOUS
  Filled 2018-01-23: qty 2

## 2018-01-23 MED ORDER — MORPHINE SULFATE (PF) 2 MG/ML IV SOLN
1.0000 mg | INTRAVENOUS | Status: DC | PRN
  Administered 2018-01-23 (×3): 2 mg via INTRAVENOUS
  Administered 2018-01-24: 1 mg via INTRAVENOUS
  Administered 2018-01-24: 2 mg via INTRAVENOUS
  Filled 2018-01-23 (×5): qty 1

## 2018-01-23 MED ORDER — CEFAZOLIN SODIUM-DEXTROSE 2-4 GM/100ML-% IV SOLN
2.0000 g | Freq: Once | INTRAVENOUS | Status: AC
  Administered 2018-01-23: 2 g via INTRAVENOUS

## 2018-01-23 MED ORDER — OXYCODONE HCL 5 MG PO TABS: 5.0000 mg | ORAL_TABLET | Freq: Once | ORAL | Status: DC | PRN

## 2018-01-23 MED ORDER — SIMETHICONE 80 MG PO CHEW
80.0000 mg | CHEWABLE_TABLET | Freq: Four times a day (QID) | ORAL | Status: DC | PRN
  Administered 2018-01-23 – 2018-01-24 (×2): 80 mg via ORAL
  Filled 2018-01-23 (×2): qty 1

## 2018-01-23 MED ORDER — FLEET ENEMA 7-19 GM/118ML RE ENEM: 1.0000 | ENEMA | Freq: Once | RECTAL | Status: DC

## 2018-01-23 MED ORDER — DEXAMETHASONE SODIUM PHOSPHATE 10 MG/ML IJ SOLN
INTRAMUSCULAR | Status: AC
  Filled 2018-01-23: qty 1

## 2018-01-23 MED ORDER — ONDANSETRON HCL 4 MG/2ML IJ SOLN: 4.0000 mg | Freq: Four times a day (QID) | INTRAMUSCULAR | Status: DC | PRN

## 2018-01-23 SURGICAL SUPPLY — 43 items
BAG URINE DRAINAGE (UROLOGICAL SUPPLIES) ×4 IMPLANT
BLADE SURG SZ11 CARB STEEL (BLADE) ×4 IMPLANT
CANISTER SUCT 1200ML W/VALVE (MISCELLANEOUS) ×4 IMPLANT
CATH FOLEY 2WAY  5CC 16FR (CATHETERS) ×1
CATH URTH 16FR FL 2W BLN LF (CATHETERS) ×3 IMPLANT
CHLORAPREP W/TINT 26ML (MISCELLANEOUS) ×4 IMPLANT
DRSG TEGADERM 2-3/8X2-3/4 SM (GAUZE/BANDAGES/DRESSINGS) ×12 IMPLANT
GLOVE BIO SURGEON STRL SZ8 (GLOVE) ×12 IMPLANT
GOWN STRL REUS W/ TWL LRG LVL3 (GOWN DISPOSABLE) ×6 IMPLANT
GOWN STRL REUS W/ TWL XL LVL3 (GOWN DISPOSABLE) ×3 IMPLANT
GOWN STRL REUS W/TWL LRG LVL3 (GOWN DISPOSABLE) ×2
GOWN STRL REUS W/TWL XL LVL3 (GOWN DISPOSABLE) ×1
GRASPER SUT TROCAR 14GX15 (MISCELLANEOUS) ×4 IMPLANT
HANDLE YANKAUER SUCT BULB TIP (MISCELLANEOUS) ×4 IMPLANT
IRRIGATION STRYKERFLOW (MISCELLANEOUS) ×3 IMPLANT
IRRIGATOR STRYKERFLOW (MISCELLANEOUS) ×4
IV LACTATED RINGERS 1000ML (IV SOLUTION) ×4 IMPLANT
KIT PINK PAD W/HEAD ARE REST (MISCELLANEOUS) ×4
KIT PINK PAD W/HEAD ARM REST (MISCELLANEOUS) ×3 IMPLANT
KIT TURNOVER CYSTO (KITS) ×4 IMPLANT
LABEL OR SOLS (LABEL) ×4 IMPLANT
MORCELLATOR XCISE  COR (MISCELLANEOUS) ×1
MORCELLATOR XCISE COR (MISCELLANEOUS) ×3 IMPLANT
NS IRRIG 500ML POUR BTL (IV SOLUTION) ×4 IMPLANT
PACK GYN LAPAROSCOPIC (MISCELLANEOUS) ×4 IMPLANT
PAD OB MATERNITY 4.3X12.25 (PERSONAL CARE ITEMS) ×4 IMPLANT
PAD PREP 24X41 OB/GYN DISP (PERSONAL CARE ITEMS) ×4 IMPLANT
SET CYSTO W/LG BORE CLAMP LF (SET/KITS/TRAYS/PACK) ×4 IMPLANT
SHEARS HARMONIC ACE PLUS 36CM (ENDOMECHANICALS) ×4 IMPLANT
SOLUTION ELECTROLUBE (MISCELLANEOUS) ×4 IMPLANT
SPONGE GAUZE 2X2 8PLY STRL LF (GAUZE/BANDAGES/DRESSINGS) ×8 IMPLANT
STRIP CLOSURE SKIN 1/2X4 (GAUZE/BANDAGES/DRESSINGS) ×4 IMPLANT
SUT VIC AB 0 CT1 18XCR BRD 8 (SUTURE) ×3 IMPLANT
SUT VIC AB 0 CT1 36 (SUTURE) ×4 IMPLANT
SUT VIC AB 0 CT1 8-18 (SUTURE) ×1
SUT VIC AB 2-0 UR6 27 (SUTURE) ×4 IMPLANT
SUT VIC AB 4-0 SH 27 (SUTURE) ×2
SUT VIC AB 4-0 SH 27XANBCTRL (SUTURE) ×6 IMPLANT
SYR 10ML LL (SYRINGE) ×4 IMPLANT
TROCAR ENDO BLADELESS 11MM (ENDOMECHANICALS) ×4 IMPLANT
TROCAR XCEL NON-BLD 5MMX100MML (ENDOMECHANICALS) ×4 IMPLANT
TROCAR XCEL UNIV SLVE 11M 100M (ENDOMECHANICALS) ×8 IMPLANT
TUBING INSUFFLATION (TUBING) ×4 IMPLANT

## 2018-01-23 NOTE — Anesthesia Procedure Notes (Signed)
Procedure Name: Intubation Date/Time: 01/23/2018 7:57 AM Performed by: Hedda Slade, CRNA Pre-anesthesia Checklist: Patient identified, Patient being monitored, Timeout performed, Emergency Drugs available and Suction available Patient Re-evaluated:Patient Re-evaluated prior to induction Oxygen Delivery Method: Circle system utilized Preoxygenation: Pre-oxygenation with 100% oxygen Induction Type: IV induction Ventilation: Mask ventilation without difficulty Laryngoscope Size: Mac and 3 Grade View: Grade I Tube type: Oral Tube size: 7.0 mm Number of attempts: 1 Airway Equipment and Method: Stylet and Patient positioned with wedge pillow Placement Confirmation: ETT inserted through vocal cords under direct vision,  positive ETCO2 and breath sounds checked- equal and bilateral Secured at: 22 cm Tube secured with: Tape Dental Injury: Teeth and Oropharynx as per pre-operative assessment

## 2018-01-23 NOTE — Anesthesia Preprocedure Evaluation (Signed)
Anesthesia Evaluation  Patient identified by MRN, date of birth, ID band Patient awake    Reviewed: Allergy & Precautions, NPO status , Patient's Chart, lab work & pertinent test results  History of Anesthesia Complications Negative for: history of anesthetic complications  Airway Mallampati: III  TM Distance: >3 FB Neck ROM: Full    Dental no notable dental hx.    Pulmonary neg pulmonary ROS, neg sleep apnea, neg COPD,    breath sounds clear to auscultation- rhonchi (-) wheezing      Cardiovascular Exercise Tolerance: Good hypertension, Pt. on medications (-) CAD, (-) Past MI, (-) Cardiac Stents and (-) CABG  Rhythm:Regular Rate:Normal - Systolic murmurs and - Diastolic murmurs    Neuro/Psych  Headaches, PSYCHIATRIC DISORDERS Depression Bipolar Disorder    GI/Hepatic negative GI ROS, Neg liver ROS,   Endo/Other  negative endocrine ROSneg diabetes  Renal/GU negative Renal ROS     Musculoskeletal negative musculoskeletal ROS (+)   Abdominal (+) + obese,   Peds  Hematology  (+) anemia ,   Anesthesia Other Findings Past Medical History: No date: Anemia No date: Chest pain No date: Hypertension   Reproductive/Obstetrics                             Anesthesia Physical Anesthesia Plan  ASA: II  Anesthesia Plan: General   Post-op Pain Management:    Induction: Intravenous  PONV Risk Score and Plan: 2 and Dexamethasone, Ondansetron and Midazolam  Airway Management Planned: Oral ETT  Additional Equipment:   Intra-op Plan:   Post-operative Plan: Extubation in OR  Informed Consent: I have reviewed the patients History and Physical, chart, labs and discussed the procedure including the risks, benefits and alternatives for the proposed anesthesia with the patient or authorized representative who has indicated his/her understanding and acceptance.   Dental advisory given  Plan  Discussed with: CRNA and Anesthesiologist  Anesthesia Plan Comments:         Anesthesia Quick Evaluation

## 2018-01-23 NOTE — Anesthesia Postprocedure Evaluation (Signed)
Anesthesia Post Note  Patient: ADORIA KAWAMOTO  Procedure(s) Performed: CYSTOSCOPY LAPAROSCOPIC LYSIS OF ADHESIONS TOTAL LAPAROSCOPIC HYSTERECTOMY WITH SALPINGECTOMY (Bilateral )  Patient location during evaluation: PACU Anesthesia Type: General Level of consciousness: awake and alert and oriented Pain management: pain level controlled Vital Signs Assessment: post-procedure vital signs reviewed and stable Respiratory status: spontaneous breathing, nonlabored ventilation and respiratory function stable Cardiovascular status: blood pressure returned to baseline and stable Postop Assessment: no signs of nausea or vomiting Anesthetic complications: no     Last Vitals:  Vitals:   01/23/18 1300 01/23/18 1400  BP: (!) 106/54 122/65  Pulse: 85 78  Resp: 20 18  Temp: 36.4 C 36.7 C  SpO2: 99% 99%    Last Pain:  Vitals:   01/23/18 1412  TempSrc:   PainSc: 6                  Aunna Snooks

## 2018-01-23 NOTE — Progress Notes (Signed)
Patient ID: Maria Dean, female   DOB: 21-Jun-1981, 37 y.o.   MRN: [id number] DOS   still requiring some IV Morphine  VSS Urine output good  No blood on pad   Continue foley and IVF  Anticipate d/c in am

## 2018-01-23 NOTE — Anesthesia Post-op Follow-up Note (Signed)
Anesthesia QCDR form completed.        

## 2018-01-23 NOTE — Op Note (Signed)
NAME:  KEYUANA, WANK NO.:  [id number]  MEDICAL RECORD NO.:  [id number]  LOCATION:                                 FACILITY:  PHYSICIAN:  Laverta Baltimore, MD     DATE OF BIRTH:  DATE OF PROCEDURE:  01/23/2018 DATE OF DISCHARGE:                              OPERATIVE REPORT   PREOPERATIVE DIAGNOSIS: 1. Menorrhagia. 2. Fibroid uterus.  POSTOPERATIVE DIAGNOSIS: 1. Menorrhagia. 2. Fibroid uterus. 3. Abdominal adhesions.  PROCEDURE PERFORMED: 1. Laparoscopic supracervical hysterectomy converted to total     laparoscopic hysterectomy and bilateral salpingectomy. 2. Lysis of adhesions. 3. Cystoscopy.  SURGEON:  Laverta Baltimore, MD  ANESTHESIA:  General endotracheal anesthesia.  FIRST ASSISTANT:  Chelsea C. Ward, MD.  INDICATION:  A 37 year old, gravida 4, para 4 patient with a greater than 57-month history of heavy menstruation and noted to have an 8 x 6 cm fibroid uterus on ultrasound.  The patient had endometrial biopsy that showed chronic endometritis and Pap smear that was negative.  DESCRIPTION OF PROCEDURE:  After adequate general endotracheal anesthesia, the patient was placed in dorsal supine position.  Legs in the [place].  The patient's abdomen, perineum, and vagina were prepped and draped in normal sterile fashion.  The patient did receive 2 g IV Ancef prior to commencement of the case for prophylaxis.  A 15 mm infraumbilical incision was made after injected with 0.5% Marcaine.  The laparoscope was advanced into the abdominal cavity under direct visualization with the Optiview cannula.  The patient's abdomen was insufflated.  A second port site was placed in left lower quadrant 3 cm medial to the left anterior iliac spine and under direct visualization, #11 port was placed.  Third port was placed in the right lower quadrant, again 3 cm medial to the right anterior iliac spine and a #11 trocar was advanced under direct  visualization.  Initial impression showed several dense omental adhesions to the anterior abdominal wall just caudad from the infraumbilical port site.  There was no bowel that was noted in these adhesions.  Harmonic scalpel was brought up and the adhesions were removed.  The patient was placed in Trendelenburg and the left fallopian tube was grasped with the fimbriated end and the fallopian tube was dissected with Harmonic scalpel.  The round ligament was then opened on the left and broad ligament was opened to the level of the left uterine artery which was cauterized with the Kleppinger cautery and transected. Bladder flap was created prior to transecting the uterine artery. Uterus again was bulbous and filled the pelvis.  Attention was directed to the patient's right fallopian tube, which was grasped at the fimbriated end and likewise the fallopian tube was dissected free from the ovary and the round ligament on the right was opened and the broad ligament was opened to identify the right uterine artery.  This was cauterized and transected as well.  While transecting the cervix, ultimately, the vagina was entered posteriorly.  The uterus and the cervix were then placed into the abdominal cavity and surgeon went to the vaginal route to close the vaginal cuff.  Interrupted 0 Vicryl suture was used to close the cuff.  Good  hemostasis was noted.  Gloves were changed.  Attention was then directed back to the abdominal cavity and the morcellator was placed through the left lower port site.  The uterus, cervix, and fallopian tubes were removed with the morcellation. Of note, an inverted, unknowing Mirena IUD was identified during the morcellation and this was brought through the port site as well.  No strings were identified during the procedure.  The patient's abdomen was copiously irrigated and suctioned.  Pressure was lowered to 7 mmHg. Hemostasis was noted.  Given the inadvertent conversion  to the total laparoscopic hysterectomy, a cystoscopy was performed at the end of the case, which revealed normal efflux of urine from each ureter into the bladder.  The previously placed Foley catheter was replaced after cystoscopy with drainage of the bladder and attention then was again redirected toward the abdominal cavity and the 3 port sites were closed with a fascial layer of 2-0 Vicryl and all incisions were closed with interrupted 4-0 Vicryl suture.  Sterile dressing applied.  There were no complications.  ESTIMATED BLOOD LOSS:  250 mL.  INTRAOPERATIVE FLUIDS:  1000 mL.  URINE OUTPUT:  300 mL.  The patient was taken to recovery room in good condition.    ______________________________ Laverta Baltimore, MD   ______________________________ Laverta Baltimore, MD    TS/MEDQ  D:  01/23/2018  T:  01/23/2018  Job:  [id number]

## 2018-01-23 NOTE — Brief Op Note (Signed)
01/23/2018  10:55 AM  PATIENT:  Maria Dean  37 y.o. female  PRE-OPERATIVE DIAGNOSIS:  menorrhagia  POST-OPERATIVE DIAGNOSIS:  menorrhagia  PROCEDURE:  Procedure(s): CYSTOSCOPY LAPAROSCOPIC LYSIS OF ADHESIONS TOTAL LAPAROSCOPIC HYSTERECTOMY WITH SALPINGECTOMY (Bilateral) LSh converted to [place]:  Surgeon(s) and Role:    * Aneri Slagel, Gwen Her, MD - Primary  PHYSICIAN ASSISTANT: Chelsea Ward , MD  ASSISTANTS: none   ANESTHESIA:   general  EBL:  250 mL , IOF 1000 cc, UO 300 cc  BLOOD ADMINISTERED:none  DRAINS: Urinary Catheter (Foley)   LOCAL MEDICATIONS USED:  MARCAINE     SPECIMEN:  Source of Specimen:  uterus  and bilateral salpingectomy   DISPOSITION OF SPECIMEN:  PATHOLOGY  COUNTS:  YES  TOURNIQUET:  * No tourniquets in log *  DICTATION: .Other Dictation: Dictation Number verbal  PLAN OF CARE: observation  PATIENT DISPOSITION:  PACU - hemodynamically stable.   Delay start of Pharmacological VTE agent (>24hrs) due to surgical blood loss or risk of bleeding: not applicable

## 2018-01-23 NOTE — Transfer of Care (Signed)
Immediate Anesthesia Transfer of Care Note  Patient: Maria Dean  Procedure(s) Performed: CYSTOSCOPY LAPAROSCOPIC LYSIS OF ADHESIONS TOTAL LAPAROSCOPIC HYSTERECTOMY WITH SALPINGECTOMY (Bilateral )  Patient Location: PACU  Anesthesia Type:General  Level of Consciousness: awake, alert  and oriented  Airway & Oxygen Therapy: Patient Spontanous Breathing and Patient connected to face mask oxygen  Post-op Assessment: Report given to RN and Post -op Vital signs reviewed and stable  Post vital signs: Reviewed and stable  Last Vitals:  Vitals Value Taken Time  BP 124/79 01/23/2018 11:14 AM  Temp 36.7 C 01/23/2018 11:14 AM  Pulse 89 01/23/2018 11:14 AM  Resp 17 01/23/2018 11:14 AM  SpO2 100 % 01/23/2018 11:14 AM    Last Pain:  Vitals:   01/23/18 1114  TempSrc: Temporal  PainSc:          Complications: No apparent anesthesia complications

## 2018-01-23 NOTE — Progress Notes (Signed)
Pt is ready for LSh and bilateral salpingectomy . Labs reviewed . NPO . She took BP meds today . Proceed

## 2018-01-24 ENCOUNTER — Encounter: Payer: Self-pay | Admitting: Obstetrics and Gynecology

## 2018-01-24 DIAGNOSIS — N920 Excessive and frequent menstruation with regular cycle: Secondary | ICD-10-CM | POA: Diagnosis present

## 2018-01-24 LAB — CBC
HCT: 31.7 % — ABNORMAL LOW (ref 35.0–47.0)
HEMOGLOBIN: 10.4 g/dL — AB (ref 12.0–16.0)
MCH: 25.8 pg — ABNORMAL LOW (ref 26.0–34.0)
MCHC: 32.7 g/dL (ref 32.0–36.0)
MCV: 78.8 fL — ABNORMAL LOW (ref 80.0–100.0)
Platelets: 476 10*3/uL — ABNORMAL HIGH (ref 150–440)
RBC: 4.02 MIL/uL (ref 3.80–5.20)
RDW: 16.2 % — ABNORMAL HIGH (ref 11.5–14.5)
WBC: 16.4 10*3/uL — ABNORMAL HIGH (ref 3.6–11.0)

## 2018-01-24 LAB — BASIC METABOLIC PANEL
Anion gap: 6 (ref 5–15)
BUN: 14 mg/dL (ref 6–20)
CHLORIDE: 105 mmol/L (ref 101–111)
CO2: 27 mmol/L (ref 22–32)
CREATININE: 0.8 mg/dL (ref 0.44–1.00)
Calcium: 9 mg/dL (ref 8.9–10.3)
GFR calc Af Amer: >60 mL/min (ref >60)
GFR calc non Af Amer: >60 mL/min (ref >60)
Glucose, Bld: 120 mg/dL — ABNORMAL HIGH (ref 65–99)
Potassium: 4.3 mmol/L (ref 3.5–5.1)
SODIUM: 138 mmol/L (ref 135–145)

## 2018-01-24 MED ORDER — NAPROXEN 500 MG PO TABS: 500.0000 mg | ORAL_TABLET | Freq: Two times a day (BID) | ORAL | 2 refills | Status: DC

## 2018-01-24 MED ORDER — OXYCODONE-ACETAMINOPHEN 5-325 MG PO TABS: 1.0000 | ORAL_TABLET | Freq: Four times a day (QID) | ORAL | 0 refills | Status: DC | PRN

## 2018-01-24 MED ORDER — DOCUSATE SODIUM 100 MG PO CAPS: 100.0000 mg | ORAL_CAPSULE | Freq: Every day | ORAL | 2 refills | Status: DC | PRN

## 2018-01-24 MED ORDER — ONDANSETRON HCL 4 MG PO TABS: 4.0000 mg | ORAL_TABLET | Freq: Four times a day (QID) | ORAL | 0 refills | Status: DC | PRN

## 2018-01-24 NOTE — Plan of Care (Signed)
Vs stable; able to remove nasal canula; able to advance diet to regular; foley catheter removed this shift; will be up with assist; has iv toradol for pain and PO percocet (to start when pt has had some food on her stomach)

## 2018-01-24 NOTE — Discharge Summary (Signed)
Physician Discharge Summary  Patient ID: Maria Dean MRN: [id number] DOB/AGE: 1980-12-30 37 y.o.  Admit date: 01/23/2018 Discharge date: 01/24/2018  Admission Diagnoses: menorrhagia fibroid  Discharge Diagnoses: same Active Problems:   Postoperative state   Discharged Condition: good  Hospital Course: underwent a LSh converted to a TLH and bilateral salpingectomy . Post op did well . Afebrile and good urine output . HCT stable   Consults: None  Significant Diagnostic Studies: labs:  Results for orders placed or performed during the hospital encounter of 01/23/18 (from the past 24 hour(s))  CBC     Status: Abnormal   Collection Time: 01/24/18  6:20 AM  Result Value Ref Range   WBC 16.4 (H) 3.6 - 11.0 K/uL   RBC 4.02 3.80 - 5.20 MIL/uL   Hemoglobin 10.4 (L) 12.0 - 16.0 g/dL   HCT 31.7 (L) 35.0 - 47.0 %   MCV 78.8 (L) 80.0 - 100.0 fL   MCH 25.8 (L) 26.0 - 34.0 pg   MCHC 32.7 32.0 - 36.0 g/dL   RDW 16.2 (H) 11.5 - 14.5 %   Platelets 476 (H) 150 - 440 K/uL  Basic metabolic panel     Status: Abnormal   Collection Time: 01/24/18  6:20 AM  Result Value Ref Range   Sodium 138 135 - 145 mmol/L   Potassium 4.3 3.5 - 5.1 mmol/L   Chloride 105 101 - 111 mmol/L   CO2 27 22 - 32 mmol/L   Glucose, Bld 120 (H) 65 - 99 mg/dL   BUN 14 6 - 20 mg/dL   Creatinine, Ser 0.80 0.44 - 1.00 mg/dL   Calcium 9.0 8.9 - 10.3 mg/dL   GFR calc non Af Amer >60 >60 mL/min   GFR calc Af Amer >60 >60 mL/min   Anion gap 6 5 - 15    Treatments: surgery: as above  Discharge Exam: Blood pressure (!) 148/82, pulse 92, temperature 98 F (36.7 C), temperature source Oral, resp. rate 20, height 5\' 6"  (1.676 m), weight (!) 148.8 kg (328 lb), SpO2 97 %. General appearance: alert and cooperative Resp: clear to auscultation bilaterally Cardio: regular rate and rhythm, S1, S2 normal, no murmur, click, rub or gallop GI: soft, non-tender; bowel sounds normal; no masses,  no organomegaly  Disposition:  home   Allergies as of 01/24/2018      Reactions   Orphenadrine Shortness Of Breath   Ephedrine Nausea And Vomiting      Medication List    STOP taking these medications   doxycycline 100 MG capsule Commonly known as:  VIBRAMYCIN   ibuprofen 800 MG tablet Commonly known as:  ADVIL,MOTRIN     TAKE these medications   docusate sodium 100 MG capsule Commonly known as:  COLACE Take 1 capsule (100 mg total) by mouth daily as needed.   metoprolol tartrate 25 MG tablet Commonly known as:  LOPRESSOR Take 25 mg by mouth 2 (two) times daily.   naproxen 500 MG tablet Commonly known as:  NAPROSYN Take 1 tablet (500 mg total) by mouth 2 (two) times daily with a meal.   ondansetron 4 MG tablet Commonly known as:  ZOFRAN Take 1 tablet (4 mg total) by mouth every 6 (six) hours as needed for nausea.   oxyCODONE-acetaminophen 5-325 MG tablet Commonly known as:  PERCOCET/ROXICET Take 1-2 tablets by mouth every 6 (six) hours as needed for moderate pain ((when tolerating fluids)). What changed:    how much to take  reasons to take this  Follow-up Information    Okey Zelek, Gwen Her, MD Follow up in 2 week(s).   Specialty:  Obstetrics and Gynecology Why:  post op  Contact information: [address] [place] [address] [phone]           Signed: Gwen Her [place] 01/24/2018, 9:42 AM

## 2018-01-24 NOTE — Progress Notes (Signed)
Pt discharged home.  Discharge instructions, prescriptions and follow up appointment given to and reviewed with pt.  Pt verbalized understanding.  Escorted by auxillary. 

## 2018-01-25 LAB — SURGICAL PATHOLOGY

## 2018-01-28 IMAGING — US US ABDOMEN LIMITED
1 series · 14 of 25 positions shown · non-contrast
Comparison: CT dated 03/06/2015

CLINICAL DATA: 34-year-old female with right upper quadrant
abdominal pain

EXAM:
US ABDOMEN LIMITED - RIGHT UPPER QUADRANT

[Series 1: us abdomen limited · 0.22mm/px · 14 of 34 slices shown]
[im 1/34]
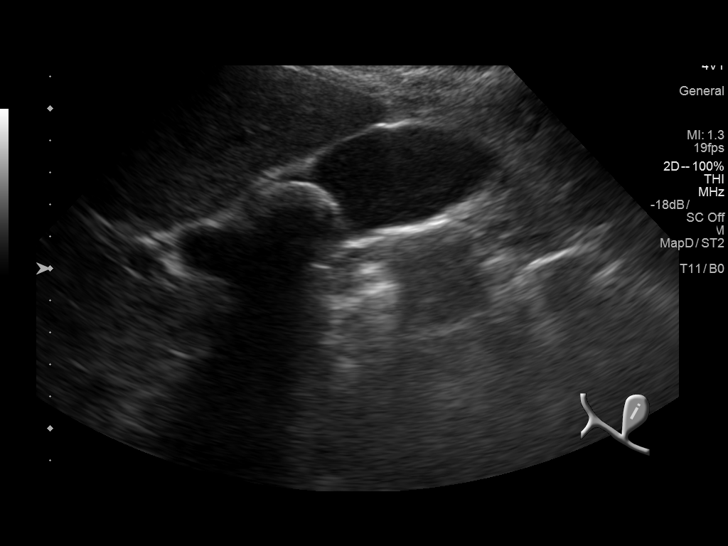
[im 3/34]
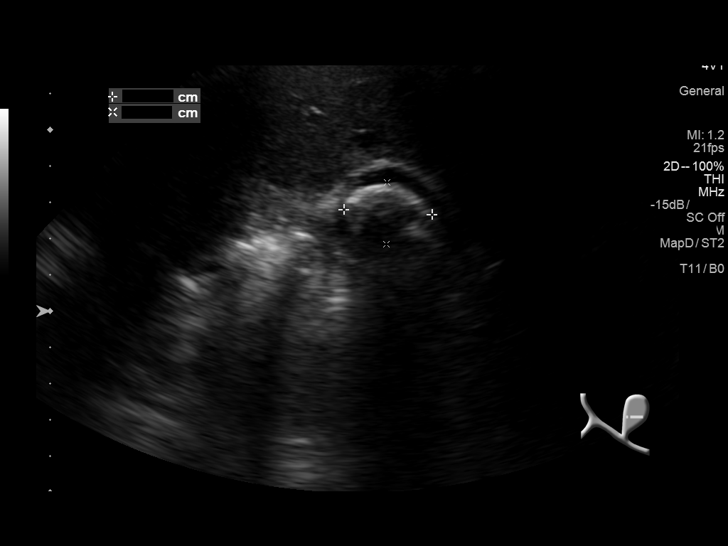
[im 6/34]
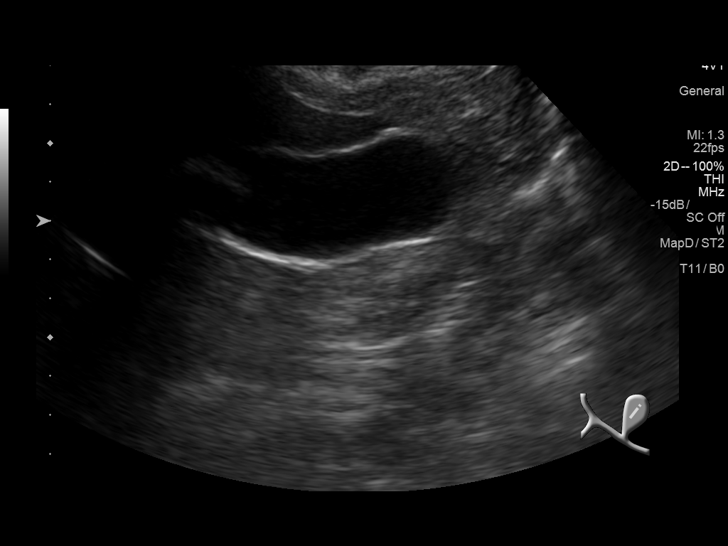
[im 9/34]
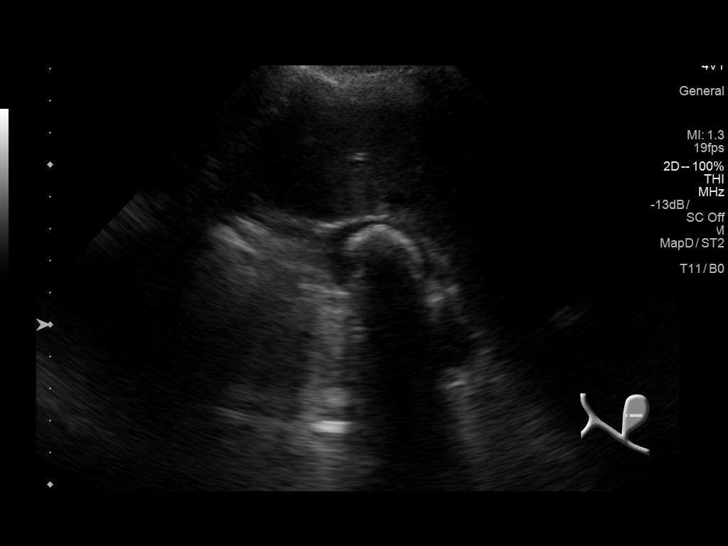
[im 12/34]
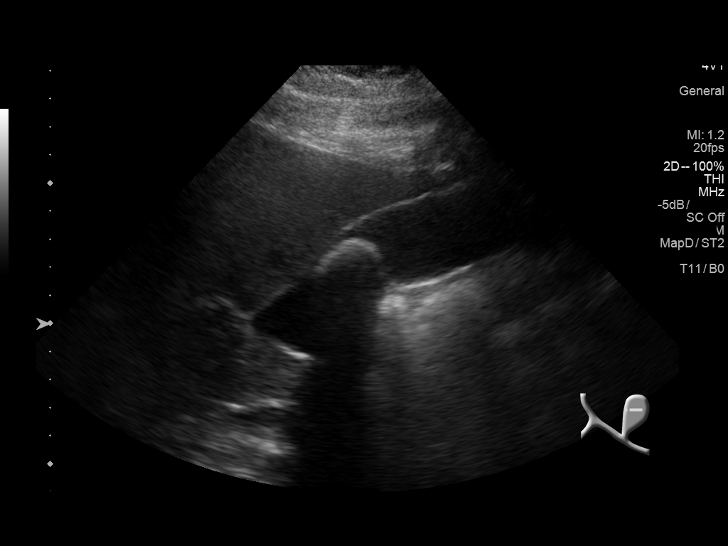
[im 13/34]
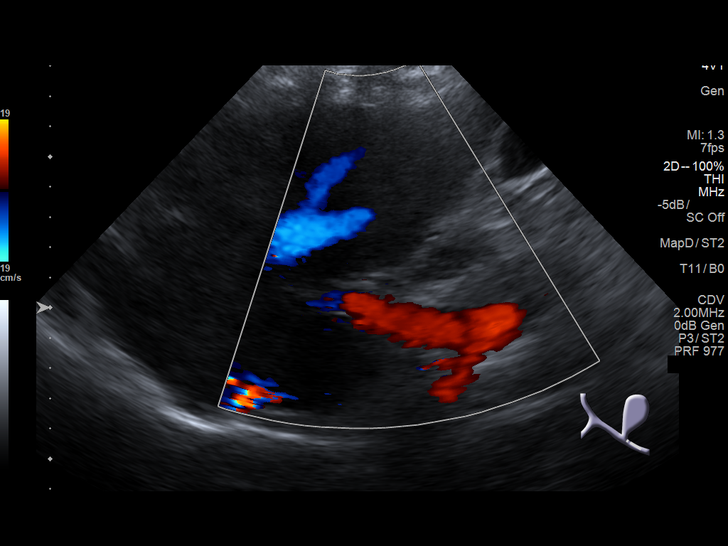
[im 16/34]
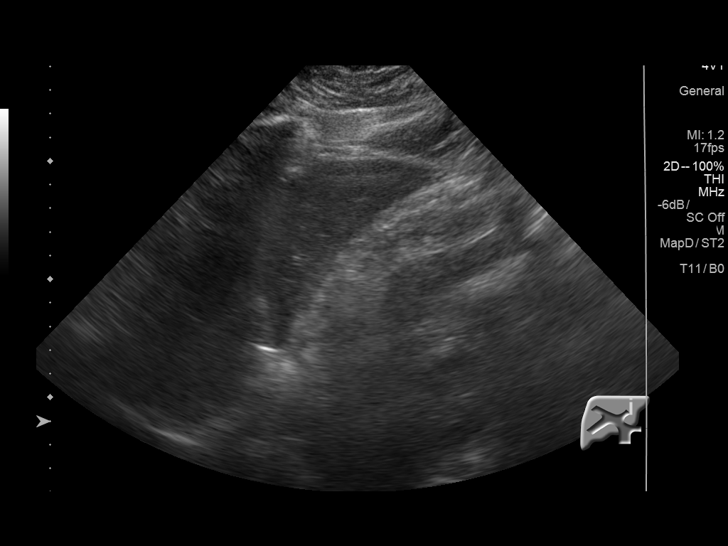
[im 18/34]
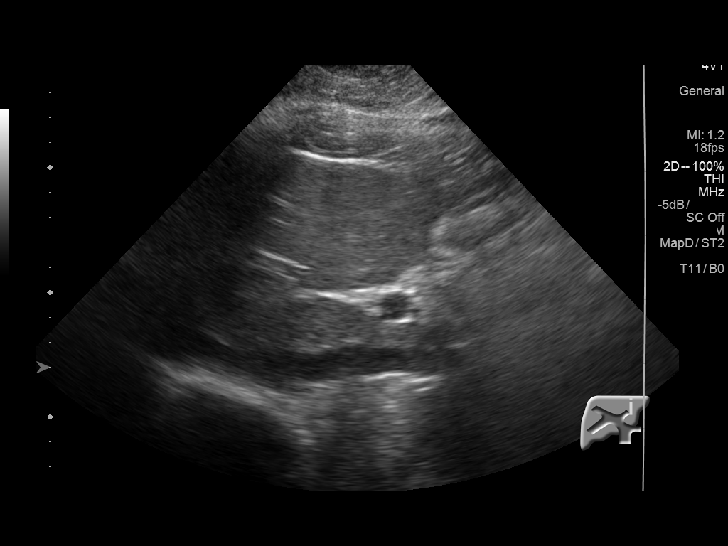
[im 21/34]
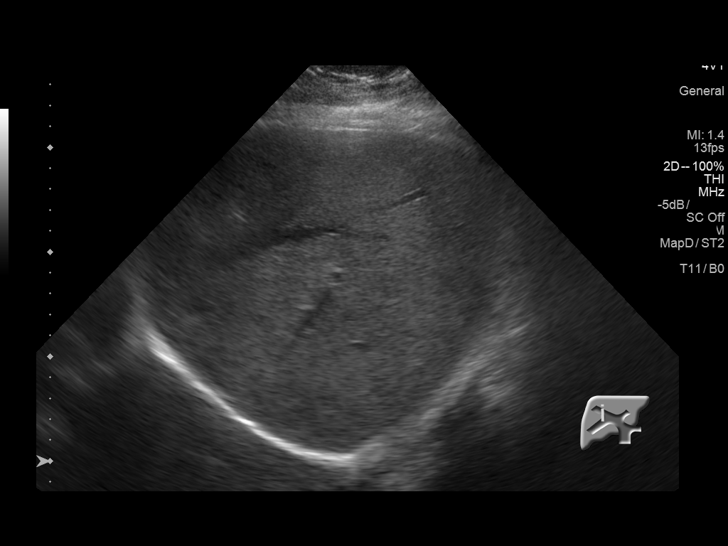
[im 23/34]
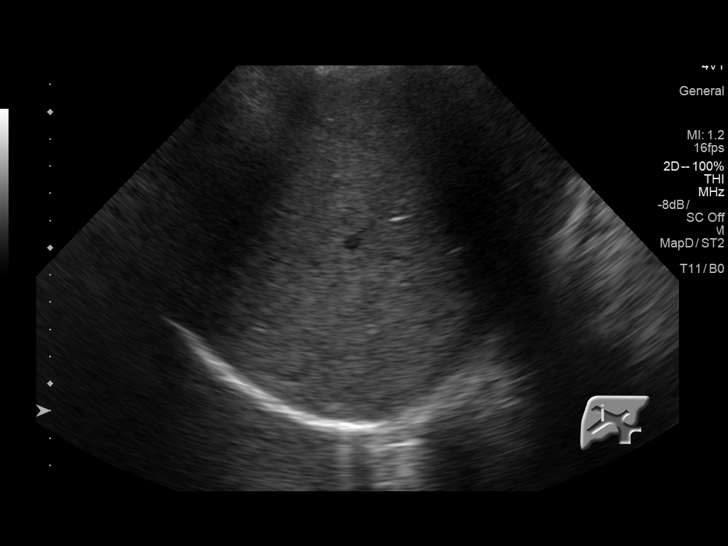
[im 25/34]
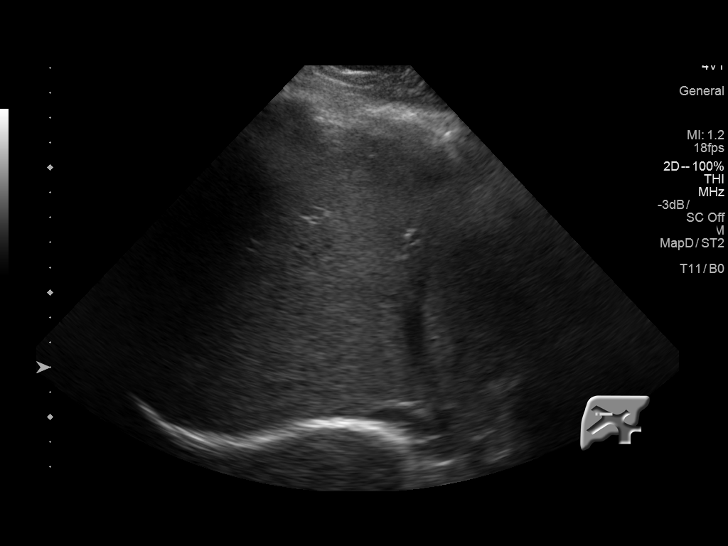
[im 28/34]
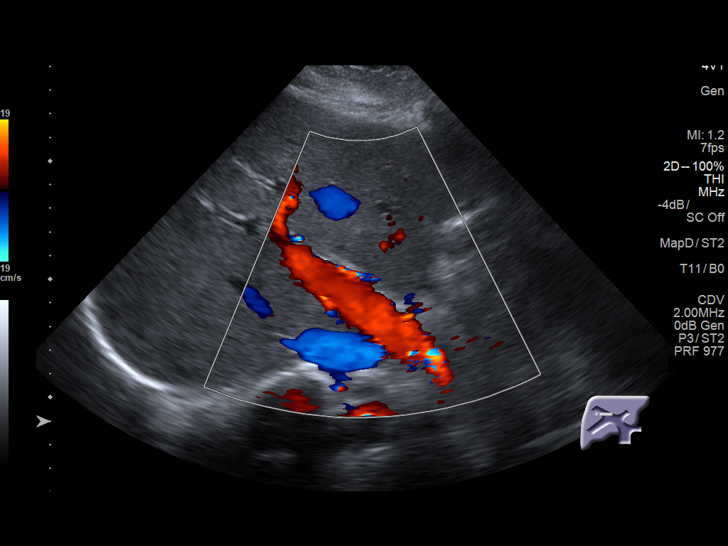
[im 31/34]
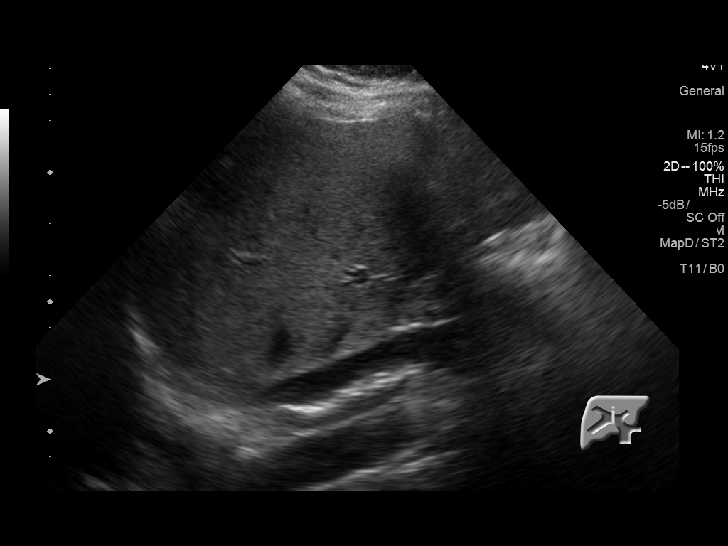
[im 34/34]
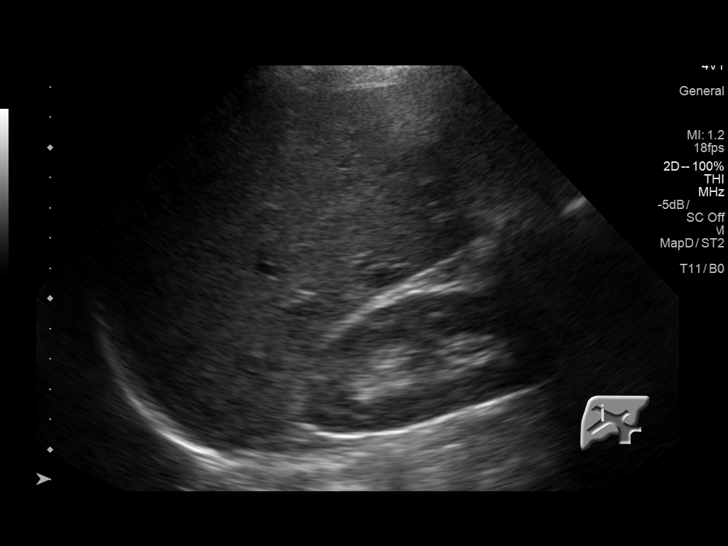

[14 of 25 positions shown; findings below may reference images not displayed]

FINDINGS: Gallbladder:

There is a large stone within the gallbladder. No gallbladder wall
thickening or pericholecystic fluid.

Common bile duct:

Diameter: 3 mm

Liver:

No focal lesion identified. Within normal limits in parenchymal
echogenicity.
IMPRESSION: Cholelithiasis without sonographic evidence acute cholecystitis. A
hepatobiliary scintigraphy may provide better evaluation of the
gallbladder if an acute cholecystitis is clinically suspected.

## 2018-01-29 ENCOUNTER — Emergency Department
Admission: EM | Admit: 2018-01-29 | Discharge: 2018-01-30 | Disposition: A | Discharge status: Home / Self Care | Payer: Self-pay | Attending: Emergency Medicine | Admitting: Emergency Medicine

## 2018-01-29 DIAGNOSIS — I10 Essential (primary) hypertension: Secondary | ICD-10-CM | POA: Insufficient documentation

## 2018-01-29 DIAGNOSIS — Z79899 Other long term (current) drug therapy: Secondary | ICD-10-CM | POA: Insufficient documentation

## 2018-01-29 DIAGNOSIS — R55 Syncope and collapse: Secondary | ICD-10-CM | POA: Insufficient documentation

## 2018-01-29 DIAGNOSIS — R1032 Left lower quadrant pain: Secondary | ICD-10-CM | POA: Insufficient documentation

## 2018-01-29 DIAGNOSIS — R1031 Right lower quadrant pain: Secondary | ICD-10-CM | POA: Insufficient documentation

## 2018-01-29 DIAGNOSIS — G8918 Other acute postprocedural pain: Secondary | ICD-10-CM | POA: Insufficient documentation

## 2018-01-29 LAB — CBC
HCT: 32.8 % — ABNORMAL LOW (ref 35.0–47.0)
HEMOGLOBIN: 10.3 g/dL — AB (ref 12.0–16.0)
MCH: 24.8 pg — AB (ref 26.0–34.0)
MCHC: 31.5 g/dL — ABNORMAL LOW (ref 32.0–36.0)
MCV: 78.8 fL — ABNORMAL LOW (ref 80.0–100.0)
Platelets: 443 10*3/uL — ABNORMAL HIGH (ref 150–440)
RBC: 4.16 MIL/uL (ref 3.80–5.20)
RDW: 16.2 % — ABNORMAL HIGH (ref 11.5–14.5)
WBC: 14.2 10*3/uL — AB (ref 3.6–11.0)

## 2018-01-29 LAB — BASIC METABOLIC PANEL
ANION GAP: 5 (ref 5–15)
BUN: 16 mg/dL (ref 6–20)
CALCIUM: 8.9 mg/dL (ref 8.9–10.3)
CO2: 29 mmol/L (ref 22–32)
Chloride: 103 mmol/L (ref 101–111)
Creatinine, Ser: 0.85 mg/dL (ref 0.44–1.00)
GFR calc non Af Amer: >60 mL/min (ref >60)
Glucose, Bld: 119 mg/dL — ABNORMAL HIGH (ref 65–99)
Potassium: 4 mmol/L (ref 3.5–5.1)
Sodium: 137 mmol/L (ref 135–145)

## 2018-01-29 NOTE — ED Provider Notes (Signed)
[facility] Emergency Department Provider Note   ____________________________________________   First MD Initiated Contact with Patient 01/29/18 2311     (approximate)  I have reviewed the triage vital signs and the nursing notes.   HISTORY  Chief Complaint Post-op Problem    HPI Maria Dean is a 37 y.o. female who presents to the ED via EMS from home with a chief complaint of low abdominal pain.  Patient had total laparoscopic hysterectomy with salpingectomy, laparoscopic lysis of adhesions and cystoscopy on 01/23/2018.  She spoke with her doctor's office the following day because she experienced some dizziness after taking her pain pill.  She was instructed to hydrate and laying in bed.  Reportedly she had a syncopal episode after the phone call with her doctor's office but did not inform her doctor.  Presents tonight because she bent over to pick up a pencil and when she straightened back up she had severe bilateral lower quadrant abdominal pain, left greater than right.  Patient denies any vaginal bleeding since the surgery.  States she had to urinate tonight but could not get up off the floor because of her lower abdominal pain which worsens when she moves.  Denies fever, chills, chest pain, shortness of breath, nausea, vomiting, dysuria, diarrhea.   Past Medical History:  Diagnosis Date  . Anemia   . Chest pain   . Hypertension     Patient Active Problem List   Diagnosis Date Noted  . Menorrhagia 01/24/2018  . Postoperative state 01/23/2018  . Abdominal pain 03/06/2016  . Calculus of gallbladder without cholecystitis without obstruction   . Intracranial meningioma ([place]) 08/20/2015  . Chest pain, central 08/13/2015  . Bipolar affective disorder, currently depressed, mild ([place]) 05/14/2015  . Morbid (severe) obesity due to excess calories ([place]) 09/03/2014  . Cervical dysplasia, moderate 08/19/2014  . BP (high blood pressure) 07/16/2014  .  Headache, migraine 07/16/2014  . Depression, postpartum 07/16/2014    Past Surgical History:  Procedure Laterality Date  . ABDOMINAL HYSTERECTOMY    . CESAREAN SECTION    . CHOLECYSTECTOMY N/A 03/06/2016   Procedure: LAPAROSCOPIC CHOLECYSTECTOMY, LIVER BIOPSY;  Surgeon: Jules Husbands, MD;  Location: [facility] ORS;  Service: General;  Laterality: N/A;  . CYSTOSCOPY  01/23/2018   Procedure: CYSTOSCOPY;  Surgeon: Schermerhorn, Gwen Her, MD;  Location: [facility] ORS;  Service: Gynecology;;  . LAPAROSCOPIC LYSIS OF ADHESIONS  01/23/2018   Procedure: LAPAROSCOPIC LYSIS OF ADHESIONS;  Surgeon: Schermerhorn, Gwen Her, MD;  Location: [facility] ORS;  Service: Gynecology;;  . LEEP    . TOTAL LAPAROSCOPIC HYSTERECTOMY WITH SALPINGECTOMY Bilateral 01/23/2018   Procedure: TOTAL LAPAROSCOPIC HYSTERECTOMY WITH SALPINGECTOMY;  Surgeon: Schermerhorn, Gwen Her, MD;  Location: [facility] ORS;  Service: Gynecology;  Laterality: Bilateral;    Prior to Admission medications   Medication Sig Start Date End Date Taking? Authorizing Provider  cyclobenzaprine (FLEXERIL) 5 MG tablet 1 tablet every 8 hours as he did for muscle spasms 01/30/18   Paulette Blanch, MD  docusate sodium (COLACE) 100 MG capsule Take 1 capsule (100 mg total) by mouth daily as needed. 01/24/18 01/24/19  Schermerhorn, Gwen Her, MD  HYDROmorphone (DILAUDID) 2 MG tablet Take 1 tablet (2 mg total) by mouth every 6 (six) hours as needed for severe pain. 01/30/18   Paulette Blanch, MD  metoprolol tartrate (LOPRESSOR) 25 MG tablet Take 25 mg by mouth 2 (two) times daily.    [provider]  naproxen (NAPROSYN) 500 MG tablet Take 1  tablet (500 mg total) by mouth 2 (two) times daily with a meal. 01/24/18 01/24/19  Schermerhorn, Gwen Her, MD  ondansetron (ZOFRAN) 4 MG tablet Take 1 tablet (4 mg total) by mouth every 6 (six) hours as needed for nausea. 01/24/18   Schermerhorn, Gwen Her, MD  oxyCODONE-acetaminophen (PERCOCET/ROXICET) 5-325 MG tablet Take 1-2 tablets by mouth every 6 (six)  hours as needed for moderate pain ((when tolerating fluids)). 01/24/18   Schermerhorn, Gwen Her, MD    Allergies Orphenadrine and Ephedrine  Family History  Problem Relation Age of Onset  . Hypertension Mother   . Hypertension Other     Social History Social History   Tobacco Use  . Smoking status: Never Smoker  . Smokeless tobacco: Never Used  Substance Use Topics  . Alcohol use: No  . Drug use: No    Review of Systems  Constitutional: No fever/chills. Eyes: No visual changes. ENT: No sore throat. Cardiovascular: Denies chest pain. Respiratory: Denies shortness of breath. Gastrointestinal: Positive for abdominal pain.  No nausea, no vomiting.  No diarrhea.  No constipation. Genitourinary: Negative for dysuria. Musculoskeletal: Negative for back pain. Skin: Negative for rash. Neurological: Negative for headaches, focal weakness or numbness.   ____________________________________________   PHYSICAL EXAM:  VITAL SIGNS: ED Triage Vitals  Enc Vitals Group     BP 01/29/18 2302 139/76     Pulse Rate 01/29/18 2302 87     Resp 01/29/18 2302 18     Temp 01/29/18 2302 98.2 F (36.8 C)     Temp src --      SpO2 01/29/18 2302 97 %     Weight 01/29/18 2304 (!) 328 lb (148.8 kg)     Height --      Head Circumference --      Peak Flow --      Pain Score 01/29/18 2304 8     Pain Loc --      Pain Edu? --      Excl. in [place]? --     Constitutional: Alert and oriented. Well appearing and in mild acute distress. Eyes: Conjunctivae are normal. PERRL. EOMI. Head: Atraumatic. Nose: No congestion/rhinnorhea. Mouth/Throat: Mucous membranes are moist.  Oropharynx non-erythematous. Neck: No stridor.   Cardiovascular: Normal rate, regular rhythm. Grossly normal heart sounds.  Good peripheral circulation. Respiratory: Normal respiratory effort.  No retractions. Lungs CTAB. Gastrointestinal: Incision sites are clean/dry/intact.  Soft and mildly tender to palpation left lower  quadrant without rebound or guarding. No distention. No abdominal bruits. No CVA tenderness. Musculoskeletal: No lower extremity tenderness nor edema.  No joint effusions. Neurologic:  Normal speech and language. No gross focal neurologic deficits are appreciated.  Skin:  Skin is warm, dry and intact. No rash noted. Psychiatric: Mood and affect are normal. Speech and behavior are normal.  ____________________________________________   LABS (all labs ordered are listed, but only abnormal results are displayed)  Labs Reviewed  BASIC METABOLIC PANEL - Abnormal; Notable for the following components:      Result Value   Glucose, Bld 119 (*)    All other components within normal limits  CBC - Abnormal; Notable for the following components:   WBC 14.2 (*)    Hemoglobin 10.3 (*)    HCT 32.8 (*)    MCV 78.8 (*)    MCH 24.8 (*)    MCHC 31.5 (*)    RDW 16.2 (*)    Platelets 443 (*)    All other components within normal limits  URINALYSIS,  COMPLETE (UACMP) WITH MICROSCOPIC - Abnormal; Notable for the following components:   Color, Urine YELLOW (*)    APPearance HAZY (*)    Squamous Epithelial / LPF 0-5 (*)    All other components within normal limits  TROPONIN I   ____________________________________________  EKG  ED ECG REPORT I, Elene Downum J, the attending physician, personally viewed and interpreted this ECG.   Date: 01/30/2018  EKG Time: 2303  Rate: 87  Rhythm: normal EKG, normal sinus rhythm  Axis: Normal  Intervals:BER  ST&T Change: Nonspecific  ____________________________________________  RADIOLOGY  ED MD interpretation: Consistent with postoperative changes  Official radiology report(s): Ct Abdomen Pelvis W Contrast  Result Date: 01/30/2018 CLINICAL DATA:  Hysterectomy on Monday. Now with dizzy spells. Increased lower abdominal pain. Patient felt a popping sensation with increased lower abdominal pain afterwards. EXAM: CT ABDOMEN AND PELVIS WITH CONTRAST TECHNIQUE:  Multidetector CT imaging of the abdomen and pelvis was performed using the standard protocol following bolus administration of intravenous contrast. CONTRAST:  171mL ISOVUE-300 IOPAMIDOL (ISOVUE-300) INJECTION 61% COMPARISON:  03/06/2015 FINDINGS: Lower chest: Lung bases are clear. Hepatobiliary: Surgical absence of the gallbladder. Mild bile duct dilatation is likely normal for postoperative state. No focal liver lesions. Pancreas: Unremarkable. No pancreatic ductal dilatation or surrounding inflammatory changes. Spleen: Normal in size without focal abnormality. Adrenals/Urinary Tract: No adrenal gland nodules. Kidneys are symmetrical. No hydronephrosis or hydroureter. Bladder wall is not thickened. There is a small amount of gas in the bladder. This may arise from recent catheterization. Infection may also have this appearance. Stomach/Bowel: Stomach, small bowel, and colon are not abnormally distended. Stool fills the colon. No inflammatory changes or wall thickening. Appendix is normal. Vascular/Lymphatic: No significant vascular findings are present. No enlarged abdominal or pelvic lymph nodes. Reproductive: Surgical absence of the uterus. No abnormal adnexal masses. Other: Small amount of free fluid in the pelvis with infiltration in the pelvic fat. Infiltration in the subcutaneous fat over the pelvis. These are likely all postoperative changes. Small amounts of subcutaneous gas scattered in the anterior abdominal wall probably due to intravenous injections or postoperative change. There is a right paracentral hernia at the level of the umbilicus containing fat. This may represent a postoperative hernia. No loculated fluid collections to suggest any abscess. No free intra-abdominal air. Musculoskeletal: No acute or significant osseous findings. IMPRESSION: 1. Postoperative changes relating to uterine resection. Small amount of free fluid and edema in the low pelvis with infiltration in the pelvic fat.  Infiltration in the anterior abdominal wall subcutaneous fat. No loculated collections. 2. Anterior abdominal wall subcutaneous gas collections could represent postoperative change or injection sites. 3. Anterior abdominal wall hernia to the right of midline at the level of the umbilicus contains fat. Possible postoperative hernia. 4. No evidence of bowel obstruction or inflammation. 5. Surgical absence of the gallbladder with mild bile duct dilatation, probably postoperative. Electronically Signed   By: Lucienne Capers M.D.   On: 01/30/2018 02:47    ____________________________________________   PROCEDURES  Procedure(s) performed: None  Procedures  Critical Care performed: No  ____________________________________________   INITIAL IMPRESSION / ASSESSMENT AND PLAN / ED COURSE  As part of my medical decision making, I reviewed the following data within the [place] notes reviewed and incorporated, Labs reviewed, Old chart reviewed and Notes from prior ED visits   37 year old female who is one-week status post laparoscopic hysterectomy presenting with left lower abdominal pain after bending over to pick up a pencil.  Also  with syncopal episode 4 days ago.  Differential diagnosis includes, but is not limited to, ovarian cyst, ovarian torsion, acute appendicitis, diverticulitis, urinary tract infection/pyelonephritis, endometriosis, bowel obstruction, colitis, renal colic, gastroenteritis, hernia, postoperative complication, etc.  Laboratory results demonstrate mild leukocytosis, stable anemia.  Troponin is unremarkable.  Likely episode of syncope related to dizziness from opiate pain medicine.  Lower abdominal pain likely secondary to musculoskeletal given the mechanism of bending over and stretching up.  However, patient is concerned she has "ripped something inside"; will obtain CT abdomen/pelvis to evaluate etiology. Mild orthostases noted.  Clinical Course as  of Jan 30 630  Mon Jan 30, 2018  0325 Updated patient of CT scan results consistent with postoperative changes.  Some of the discomfort she is describing may also be secondary to musculoskeletal spasms.  Will discharge home on Dilaudid tablets to be alternating with the Percocet she already has at home; Flexeril every 8 hours and she is to continue Naprosyn as instructed by her doctor. Strict return precautions given. Patient verbalizes understanding and agrees with plan of care.   [JS]    Clinical Course User Index [JS] Paulette Blanch, MD     ____________________________________________   FINAL CLINICAL IMPRESSION(S) / ED DIAGNOSES  Final diagnoses:  Post-operative pain  Syncope, unspecified syncope type     ED Discharge Orders        Ordered    HYDROmorphone (DILAUDID) 2 MG tablet  Every 6 hours PRN     01/30/18 0329    cyclobenzaprine (FLEXERIL) 5 MG tablet     01/30/18 0329       Note:  This document was prepared using Dragon voice recognition software and may include unintentional dictation errors.    Paulette Blanch, MD 01/30/18 [phone]

## 2018-01-29 NOTE — ED Triage Notes (Signed)
Patient reports she passed out Wednesday after calling her doctor. Patient did not inform her MD that she passed out.

## 2018-01-29 NOTE — ED Triage Notes (Signed)
Patient had a hysterectomy Monday. Patient called her OB Wednesday due to dizziness spells. Patient was instructed to remain in bed as much as possible - patient not instructed to be evaluated by physician. Patient called EMS today for increased lower abdominal pain post partial hysterectomy. Patient reports that pain begun after she leaned down to pick an object off floor. Patient felt a 'popping' sensation and had markedly increased lower left abdominal pain afterwards.

## 2018-01-30 ENCOUNTER — Encounter: Payer: Self-pay | Admitting: Radiology

## 2018-01-30 ENCOUNTER — Emergency Department: Payer: Self-pay

## 2018-01-30 LAB — URINALYSIS, COMPLETE (UACMP) WITH MICROSCOPIC
BACTERIA UA: NONE SEEN
BILIRUBIN URINE: NEGATIVE
Glucose, UA: NEGATIVE mg/dL
HGB URINE DIPSTICK: NEGATIVE
KETONES UR: NEGATIVE mg/dL
LEUKOCYTES UA: NEGATIVE
NITRITE: NEGATIVE
PROTEIN: NEGATIVE mg/dL
Specific Gravity, Urine: 1.026 (ref 1.005–1.030)
pH: 6 (ref 5.0–8.0)

## 2018-01-30 LAB — TROPONIN I

## 2018-01-30 MED ORDER — IOPAMIDOL (ISOVUE-300) INJECTION 61%
125.0000 mL | Freq: Once | INTRAVENOUS | Status: AC | PRN
  Administered 2018-01-30: 125 mL via INTRAVENOUS

## 2018-01-30 MED ORDER — IOPAMIDOL (ISOVUE-300) INJECTION 61%
15.0000 mL | INTRAVENOUS | Status: AC
  Administered 2018-01-30 (×2): 15 mL via ORAL

## 2018-01-30 MED ORDER — HYDROMORPHONE HCL 1 MG/ML IJ SOLN
0.5000 mg | Freq: Once | INTRAMUSCULAR | Status: AC
  Administered 2018-01-30: 0.5 mg via INTRAVENOUS
  Filled 2018-01-30: qty 1

## 2018-01-30 MED ORDER — SODIUM CHLORIDE 0.9 % IV BOLUS
500.0000 mL | Freq: Once | INTRAVENOUS | Status: AC
  Administered 2018-01-30: 500 mL via INTRAVENOUS

## 2018-01-30 MED ORDER — HYDROMORPHONE HCL 2 MG PO TABS: 2.0000 mg | ORAL_TABLET | Freq: Four times a day (QID) | ORAL | 0 refills | Status: DC | PRN

## 2018-01-30 MED ORDER — CYCLOBENZAPRINE HCL 10 MG PO TABS
5.0000 mg | ORAL_TABLET | Freq: Once | ORAL | Status: AC
  Administered 2018-01-30: 5 mg via ORAL
  Filled 2018-01-30: qty 1

## 2018-01-30 MED ORDER — CYCLOBENZAPRINE HCL 5 MG PO TABS: ORAL_TABLET | ORAL | 0 refills | Status: DC

## 2018-01-30 MED ORDER — ONDANSETRON HCL 4 MG/2ML IJ SOLN
4.0000 mg | Freq: Once | INTRAMUSCULAR | Status: AC
  Administered 2018-01-30: 4 mg via INTRAVENOUS
  Filled 2018-01-30: qty 2

## 2018-01-30 NOTE — ED Notes (Signed)
Patient transported to CT 

## 2018-01-30 NOTE — ED Notes (Signed)
Reviewed discharge instructions, follow-up care, and prescriptions with patient. Patient verbalized understanding of all information reviewed. Patient stable at this time.

## 2018-01-30 NOTE — Discharge Instructions (Signed)
1.  Alternate Percocet with Dilaudid tablets every 4 hours as needed for pain. 2.  You may take muscle relaxer every 8 hours as needed. 3.  Apply moist heat to affected area several times daily. 4.  Return to the ER for worsening symptoms, persistent vomiting, difficulty breathing or other concerns.

## 2018-05-31 ENCOUNTER — Other Ambulatory Visit: Payer: Self-pay

## 2018-05-31 ENCOUNTER — Encounter: Payer: Self-pay | Admitting: Emergency Medicine

## 2018-05-31 ENCOUNTER — Emergency Department
Admission: EM | Admit: 2018-05-31 | Discharge: 2018-05-31 | Disposition: A | Discharge status: Home / Self Care | Payer: Medicaid Other | Attending: Emergency Medicine | Admitting: Emergency Medicine

## 2018-05-31 DIAGNOSIS — I10 Essential (primary) hypertension: Secondary | ICD-10-CM | POA: Insufficient documentation

## 2018-05-31 DIAGNOSIS — H53149 Visual discomfort, unspecified: Secondary | ICD-10-CM | POA: Insufficient documentation

## 2018-05-31 DIAGNOSIS — G43801 Other migraine, not intractable, with status migrainosus: Secondary | ICD-10-CM

## 2018-05-31 DIAGNOSIS — Z79899 Other long term (current) drug therapy: Secondary | ICD-10-CM | POA: Insufficient documentation

## 2018-05-31 DIAGNOSIS — R51 Headache: Secondary | ICD-10-CM | POA: Insufficient documentation

## 2018-05-31 DIAGNOSIS — R448 Other symptoms and signs involving general sensations and perceptions: Secondary | ICD-10-CM | POA: Insufficient documentation

## 2018-05-31 LAB — URINALYSIS, COMPLETE (UACMP) WITH MICROSCOPIC
BILIRUBIN URINE: NEGATIVE
Glucose, UA: NEGATIVE mg/dL
HGB URINE DIPSTICK: NEGATIVE
KETONES UR: NEGATIVE mg/dL
LEUKOCYTES UA: NEGATIVE
Nitrite: NEGATIVE
PROTEIN: NEGATIVE mg/dL
Specific Gravity, Urine: 1.013 (ref 1.005–1.030)
pH: 7 (ref 5.0–8.0)

## 2018-05-31 LAB — BASIC METABOLIC PANEL
ANION GAP: 8 (ref 5–15)
BUN: 11 mg/dL (ref 6–20)
CHLORIDE: 106 mmol/L (ref 98–111)
CO2: 26 mmol/L (ref 22–32)
Calcium: 9.2 mg/dL (ref 8.9–10.3)
Creatinine, Ser: 0.68 mg/dL (ref 0.44–1.00)
GFR calc Af Amer: >60 mL/min (ref >60)
Glucose, Bld: 99 mg/dL (ref 70–99)
POTASSIUM: 3.8 mmol/L (ref 3.5–5.1)
Sodium: 140 mmol/L (ref 135–145)

## 2018-05-31 LAB — CBC
HEMATOCRIT: 35.8 % (ref 35.0–47.0)
HEMOGLOBIN: 11.8 g/dL — AB (ref 12.0–16.0)
MCH: 25.4 pg — ABNORMAL LOW (ref 26.0–34.0)
MCHC: 33.1 g/dL (ref 32.0–36.0)
MCV: 77 fL — AB (ref 80.0–100.0)
Platelets: 395 10*3/uL (ref 150–440)
RBC: 4.65 MIL/uL (ref 3.80–5.20)
RDW: 18.4 % — ABNORMAL HIGH (ref 11.5–14.5)
WBC: 9 10*3/uL (ref 3.6–11.0)

## 2018-05-31 MED ORDER — KETOROLAC TROMETHAMINE 60 MG/2ML IM SOLN
15.0000 mg | Freq: Once | INTRAMUSCULAR | Status: AC
  Administered 2018-05-31: 15 mg via INTRAMUSCULAR
  Filled 2018-05-31: qty 2

## 2018-05-31 MED ORDER — METOCLOPRAMIDE HCL 10 MG PO TABS: 10.0000 mg | ORAL_TABLET | Freq: Four times a day (QID) | ORAL | 0 refills | Status: AC | PRN

## 2018-05-31 MED ORDER — FAMOTIDINE 20 MG PO TABS: 20.0000 mg | ORAL_TABLET | Freq: Two times a day (BID) | ORAL | 0 refills | Status: DC

## 2018-05-31 MED ORDER — METOCLOPRAMIDE HCL 10 MG PO TABS
10.0000 mg | ORAL_TABLET | Freq: Once | ORAL | Status: AC
Start: 2018-05-31 — End: 2018-05-31
  Administered 2018-05-31: 10 mg via ORAL
  Filled 2018-05-31: qty 1

## 2018-05-31 MED ORDER — DIPHENHYDRAMINE HCL 25 MG PO CAPS
50.0000 mg | ORAL_CAPSULE | Freq: Once | ORAL | Status: AC
  Administered 2018-05-31: 50 mg via ORAL
  Filled 2018-05-31: qty 2

## 2018-05-31 NOTE — ED Provider Notes (Signed)
[facility] Emergency Department Provider Note  ____________________________________________  Time seen: Approximately 4:32 PM  I have reviewed the triage vital signs and the nursing notes.   HISTORY  Chief Complaint Dizziness    HPI Maria Dean is a 37 y.o. female with a history of hypertension bipolar disorder and hysterectomy who complains of dizziness and right-sided headache for the past 4 days.  Feels consistent with her usual migraine pattern.  Headache is worsened by lights and sounds.  No alleviating factors.  Nausea but no vomiting.  No fever or chills or neck stiffness.  Moderate intensity, aching.      Past Medical History:  Diagnosis Date  . Anemia   . Chest pain   . Hypertension      Patient Active Problem List   Diagnosis Date Noted  . Menorrhagia 01/24/2018  . Postoperative state 01/23/2018  . Abdominal pain 03/06/2016  . Calculus of gallbladder without cholecystitis without obstruction   . Intracranial meningioma ([place]) 08/20/2015  . Chest pain, central 08/13/2015  . Bipolar affective disorder, currently depressed, mild ([place]) 05/14/2015  . Morbid (severe) obesity due to excess calories ([place]) 09/03/2014  . Cervical dysplasia, moderate 08/19/2014  . BP (high blood pressure) 07/16/2014  . Headache, migraine 07/16/2014  . Depression, postpartum 07/16/2014     Past Surgical History:  Procedure Laterality Date  . ABDOMINAL HYSTERECTOMY    . CESAREAN SECTION    . CHOLECYSTECTOMY N/A 03/06/2016   Procedure: LAPAROSCOPIC CHOLECYSTECTOMY, LIVER BIOPSY;  Surgeon: Jules Husbands, MD;  Location: [facility] ORS;  Service: General;  Laterality: N/A;  . CYSTOSCOPY  01/23/2018   Procedure: CYSTOSCOPY;  Surgeon: Schermerhorn, Gwen Her, MD;  Location: [facility] ORS;  Service: Gynecology;;  . LAPAROSCOPIC LYSIS OF ADHESIONS  01/23/2018   Procedure: LAPAROSCOPIC LYSIS OF ADHESIONS;  Surgeon: Schermerhorn, Gwen Her, MD;  Location: [facility] ORS;  Service:  Gynecology;;  . LEEP    . TOTAL LAPAROSCOPIC HYSTERECTOMY WITH SALPINGECTOMY Bilateral 01/23/2018   Procedure: TOTAL LAPAROSCOPIC HYSTERECTOMY WITH SALPINGECTOMY;  Surgeon: Schermerhorn, Gwen Her, MD;  Location: [facility] ORS;  Service: Gynecology;  Laterality: Bilateral;     Prior to Admission medications   Medication Sig Start Date End Date Taking? Authorizing Provider  cyclobenzaprine (FLEXERIL) 5 MG tablet 1 tablet every 8 hours as he did for muscle spasms 01/30/18   Paulette Blanch, MD  docusate sodium (COLACE) 100 MG capsule Take 1 capsule (100 mg total) by mouth daily as needed. 01/24/18 01/24/19  Schermerhorn, Gwen Her, MD  famotidine (PEPCID) 20 MG tablet Take 1 tablet (20 mg total) by mouth 2 (two) times daily. 05/31/18   Carrie Mew, MD  HYDROmorphone (DILAUDID) 2 MG tablet Take 1 tablet (2 mg total) by mouth every 6 (six) hours as needed for severe pain. 01/30/18   Paulette Blanch, MD  metoCLOPramide (REGLAN) 10 MG tablet Take 1 tablet (10 mg total) by mouth every 6 (six) hours as needed. 05/31/18   Carrie Mew, MD  metoprolol tartrate (LOPRESSOR) 25 MG tablet Take 25 mg by mouth 2 (two) times daily.    [provider]  naproxen (NAPROSYN) 500 MG tablet Take 1 tablet (500 mg total) by mouth 2 (two) times daily with a meal. 01/24/18 01/24/19  Schermerhorn, Gwen Her, MD  ondansetron (ZOFRAN) 4 MG tablet Take 1 tablet (4 mg total) by mouth every 6 (six) hours as needed for nausea. 01/24/18   Schermerhorn, Gwen Her, MD  oxyCODONE-acetaminophen (PERCOCET/ROXICET) 5-325 MG tablet Take 1-2 tablets by mouth  every 6 (six) hours as needed for moderate pain ((when tolerating fluids)). 01/24/18   Schermerhorn, Gwen Her, MD     Allergies Orphenadrine and Ephedrine   Family History  Problem Relation Age of Onset  . Hypertension Mother   . Hypertension Other     Social History Social History   Tobacco Use  . Smoking status: Never Smoker  . Smokeless tobacco: Never Used  Substance Use Topics  .  Alcohol use: No  . Drug use: No    Review of Systems  Constitutional:   No fever or chills.  ENT:   No sore throat. No rhinorrhea. Cardiovascular:   No chest pain or syncope. Respiratory:   No dyspnea or cough. Gastrointestinal:   Negative for abdominal pain, vomiting and diarrhea.  Musculoskeletal:   Negative for focal pain or swelling All other systems reviewed and are negative except as documented above in ROS and HPI.  ____________________________________________   PHYSICAL EXAM:  VITAL SIGNS: ED Triage Vitals  Enc Vitals Group     BP 05/31/18 1154 (!) 174/103     Pulse Rate 05/31/18 1154 93     Resp 05/31/18 1154 16     Temp 05/31/18 1154 98.6 F (37 C)     Temp Source 05/31/18 1154 Oral     SpO2 05/31/18 1154 94 %     Weight 05/31/18 1151 (!) 325 lb (147.4 kg)     Height 05/31/18 1151 5\' 6"  (1.676 m)     Head Circumference --      Peak Flow --      Pain Score 05/31/18 1151 0     Pain Loc --      Pain Edu? --      Excl. in [place]? --     Vital signs reviewed, nursing assessments reviewed.   Constitutional:   Alert and oriented. Non-toxic appearance. Eyes:   Conjunctivae are normal. EOMI. PERRL. ENT      Head:   Normocephalic and atraumatic.      Nose:   No congestion/rhinnorhea.       Mouth/Throat:   MMM, no pharyngeal erythema. No peritonsillar mass.       Neck:   No meningismus. Full ROM. Hematological/Lymphatic/Immunilogical:   No cervical lymphadenopathy. Cardiovascular:   RRR. Symmetric bilateral radial and DP pulses.  No murmurs. Cap refill less than 2 seconds. Respiratory:   Normal respiratory effort without tachypnea/retractions. Breath sounds are clear and equal bilaterally. No wheezes/rales/rhonchi. Gastrointestinal:   Soft and nontender. Non distended. There is no CVA tenderness.  No rebound, rigidity, or guarding. Musculoskeletal:   Normal range of motion in all extremities. No joint effusions.  No lower extremity tenderness.  No edema. Neurologic:    Normal speech and language.  Motor grossly intact. No acute focal neurologic deficits are appreciated.  Skin:    Skin is warm, dry and intact. No rash noted.  No petechiae, purpura, or bullae.  ____________________________________________    LABS (pertinent positives/negatives) (all labs ordered are listed, but only abnormal results are displayed) Labs Reviewed  CBC - Abnormal; Notable for the following components:      Result Value   Hemoglobin 11.8 (*)    MCV 77.0 (*)    MCH 25.4 (*)    RDW 18.4 (*)    All other components within normal limits  URINALYSIS, COMPLETE (UACMP) WITH MICROSCOPIC - Abnormal; Notable for the following components:   Color, Urine YELLOW (*)    APPearance CLEAR (*)    Bacteria, UA  RARE (*)    All other components within normal limits  BASIC METABOLIC PANEL  CBG MONITORING, ED  POC URINE PREG, ED   ____________________________________________   EKG  Interpreted by me Normal sinus rhythm rate of 92, normal axis and intervals.  Poor R wave progression.  Normal ST segments and T waves.  ____________________________________________    RADIOLOGY  No results found.  ____________________________________________   PROCEDURES Procedures  ____________________________________________    CLINICAL IMPRESSION / ASSESSMENT AND PLAN / ED COURSE  Pertinent labs & imaging results that were available during my care of the patient were reviewed by me and considered in my medical decision making (see chart for details).    Patient complains of right-sided headache consistent with her usual migraine headaches for the past 4 days.  She has photophobia, appears classical.  Treat her with intramuscular Toradol, Reglan and Benadryl.  ----------------------------------------- 4:34 PM on 05/31/2018 -----------------------------------------  Patient feels better.  Ambulating in the brightly lit emergency department, states she is ready to go home.  Suitable  for discharge.  Doubt any other acute neurologic process such as stroke intracranial hemorrhage intracranial hypertension meningitis encephalitis or glaucoma.      ____________________________________________   FINAL CLINICAL IMPRESSION(S) / ED DIAGNOSES    Final diagnoses:  Other migraine with status migrainosus, not intractable     ED Discharge Orders        Ordered    metoCLOPramide (REGLAN) 10 MG tablet  Every 6 hours PRN     05/31/18 1631    famotidine (PEPCID) 20 MG tablet  2 times daily     05/31/18 1631      Portions of this note were generated with dragon dictation software. Dictation errors may occur despite best attempts at proofreading.    Carrie Mew, MD 05/31/18 [phone]

## 2018-05-31 NOTE — ED Triage Notes (Signed)
Pt in via POV with complaints of dizziness x 4 days, denies any other complaints.  NAD noted at this time.

## 2018-08-07 ENCOUNTER — Emergency Department
Admission: EM | Admit: 2018-08-07 | Discharge: 2018-08-07 | Disposition: A | Discharge status: Home / Self Care | Payer: No Typology Code available for payment source | Attending: Emergency Medicine | Admitting: Emergency Medicine

## 2018-08-07 ENCOUNTER — Other Ambulatory Visit: Payer: Self-pay

## 2018-08-07 ENCOUNTER — Emergency Department: Payer: No Typology Code available for payment source

## 2018-08-07 ENCOUNTER — Encounter: Payer: Self-pay | Admitting: *Deleted

## 2018-08-07 DIAGNOSIS — S161XXA Strain of muscle, fascia and tendon at neck level, initial encounter: Secondary | ICD-10-CM | POA: Diagnosis not present

## 2018-08-07 DIAGNOSIS — I10 Essential (primary) hypertension: Secondary | ICD-10-CM | POA: Diagnosis not present

## 2018-08-07 DIAGNOSIS — Y92410 Unspecified street and highway as the place of occurrence of the external cause: Secondary | ICD-10-CM | POA: Insufficient documentation

## 2018-08-07 DIAGNOSIS — S199XXA Unspecified injury of neck, initial encounter: Secondary | ICD-10-CM | POA: Diagnosis present

## 2018-08-07 DIAGNOSIS — Y999 Unspecified external cause status: Secondary | ICD-10-CM | POA: Diagnosis not present

## 2018-08-07 DIAGNOSIS — M25512 Pain in left shoulder: Secondary | ICD-10-CM | POA: Insufficient documentation

## 2018-08-07 DIAGNOSIS — Y9389 Activity, other specified: Secondary | ICD-10-CM | POA: Diagnosis not present

## 2018-08-07 MED ORDER — MELOXICAM 15 MG PO TABS: 15.0000 mg | ORAL_TABLET | Freq: Every day | ORAL | 2 refills | Status: AC

## 2018-08-07 MED ORDER — METOPROLOL TARTRATE 50 MG PO TABS
50.0000 mg | ORAL_TABLET | Freq: Once | ORAL | Status: AC
  Administered 2018-08-07: 50 mg via ORAL
  Filled 2018-08-07: qty 1

## 2018-08-07 MED ORDER — BACLOFEN 10 MG PO TABS: 10.0000 mg | ORAL_TABLET | Freq: Three times a day (TID) | ORAL | 1 refills | Status: AC

## 2018-08-07 MED ORDER — AMLODIPINE BESYLATE 5 MG PO TABS
10.0000 mg | ORAL_TABLET | Freq: Once | ORAL | Status: AC
  Administered 2018-08-07: 10 mg via ORAL
  Filled 2018-08-07: qty 2

## 2018-08-07 NOTE — ED Notes (Signed)
Maria Schwartz, PA-C made aware of BP 186/110 and pt was cleared for d/c. Pt given clear instructions to follow up with PCP and take medications as prescribed. Pt verbalized understanding.
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
## 2018-08-07 NOTE — Discharge Instructions (Signed)
Follow-up with your regular doctor or Dr. Sabra Heck if not better in 5 7 days.  Use medications as prescribed.  Return emergency department if worsening.  I ice to all areas that hurt for the next 3 days.  After that you may use wet heat followed by ice.
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
## 2019-08-21 ENCOUNTER — Other Ambulatory Visit: Payer: Self-pay

## 2019-08-21 DIAGNOSIS — Z20822 Contact with and (suspected) exposure to covid-19: Secondary | ICD-10-CM

## 2019-08-22 LAB — NOVEL CORONAVIRUS, NAA: SARS-CoV-2, NAA: NOT DETECTED

## 2019-09-07 ENCOUNTER — Other Ambulatory Visit: Payer: Self-pay

## 2019-09-07 DIAGNOSIS — Z20822 Contact with and (suspected) exposure to covid-19: Secondary | ICD-10-CM

## 2019-09-10 LAB — NOVEL CORONAVIRUS, NAA: SARS-CoV-2, NAA: NOT DETECTED
